# Patient Record
Sex: Female | Born: 1985 | Race: White | Hispanic: No | Marital: Married | Smoking: Never smoker
Health system: Southern US, Community
[De-identification: ages and names within clinical notes are randomized; demographics above are authoritative.]

## PROBLEM LIST (undated history)

## (undated) DIAGNOSIS — R87629 Unspecified abnormal cytological findings in specimens from vagina: Secondary | ICD-10-CM

## (undated) DIAGNOSIS — Z9889 Other specified postprocedural states: Secondary | ICD-10-CM

## (undated) DIAGNOSIS — O3440 Maternal care for other abnormalities of cervix, unspecified trimester: Secondary | ICD-10-CM

## (undated) HISTORY — DX: Unspecified abnormal cytological findings in specimens from vagina: R87.629

---

## 2010-09-05 HISTORY — PX: LEEP: SHX91

## 2016-03-04 ENCOUNTER — Ambulatory Visit (INDEPENDENT_AMBULATORY_CARE_PROVIDER_SITE_OTHER): Payer: BLUE CROSS/BLUE SHIELD | Admitting: Physician Assistant

## 2016-03-04 VITALS — BP 96/62 | HR 106 | Temp 98.2°F | Resp 18 | Ht 65.0 in | Wt 133.0 lb

## 2016-03-04 DIAGNOSIS — N390 Urinary tract infection, site not specified: Secondary | ICD-10-CM | POA: Diagnosis not present

## 2016-03-04 DIAGNOSIS — R1031 Right lower quadrant pain: Secondary | ICD-10-CM | POA: Diagnosis not present

## 2016-03-04 DIAGNOSIS — N12 Tubulo-interstitial nephritis, not specified as acute or chronic: Secondary | ICD-10-CM | POA: Diagnosis not present

## 2016-03-04 DIAGNOSIS — R82998 Other abnormal findings in urine: Secondary | ICD-10-CM

## 2016-03-04 LAB — COMPLETE METABOLIC PANEL WITH GFR
ALK PHOS: 58 U/L (ref 33–115)
ALT: 11 U/L (ref 6–29)
AST: 16 U/L (ref 10–30)
Albumin: 5 g/dL (ref 3.6–5.1)
BILIRUBIN TOTAL: 0.5 mg/dL (ref 0.2–1.2)
BUN: 20 mg/dL (ref 7–25)
CO2: 26 mmol/L (ref 20–31)
Calcium: 10.2 mg/dL (ref 8.6–10.2)
Chloride: 101 mmol/L (ref 98–110)
Creat: 0.92 mg/dL (ref 0.50–1.10)
GFR, EST NON AFRICAN AMERICAN: 84 mL/min (ref >=60)
GLUCOSE: 84 mg/dL (ref 65–99)
POTASSIUM: 4.3 mmol/L (ref 3.5–5.3)
SODIUM: 140 mmol/L (ref 135–146)
Total Protein: 7.7 g/dL (ref 6.1–8.1)

## 2016-03-04 LAB — POC MICROSCOPIC URINALYSIS (UMFC): MUCUS RE: ABSENT

## 2016-03-04 LAB — POCT URINALYSIS DIP (MANUAL ENTRY)
BILIRUBIN UA: NEGATIVE
BILIRUBIN UA: NEGATIVE
GLUCOSE UA: NEGATIVE
NITRITE UA: NEGATIVE
Protein Ur, POC: NEGATIVE
Spec Grav, UA: 1.01
Urobilinogen, UA: 0.2
pH, UA: 6.5

## 2016-03-04 LAB — POCT CBC
Granulocyte percent: 61 %G (ref 37–80)
HEMATOCRIT: 38.7 % (ref 37.7–47.9)
Hemoglobin: 14 g/dL (ref 12.2–16.2)
LYMPH, POC: 1.4 (ref 0.6–3.4)
MCH, POC: 31.2 pg (ref 27–31.2)
MCHC: 36.2 g/dL — AB (ref 31.8–35.4)
MCV: 86.2 fL (ref 80–97)
MID (CBC): 0.5 (ref 0–0.9)
MPV: 7 fL (ref 0–99.8)
POC GRANULOCYTE: 2.9 (ref 2–6.9)
POC LYMPH %: 29.1 % (ref 10–50)
POC MID %: 9.9 % (ref 0–12)
Platelet Count, POC: 207 10*3/uL (ref 142–424)
RBC: 4.49 M/uL (ref 4.04–5.48)
RDW, POC: 1.4 %
WBC: 4.7 10*3/uL (ref 4.6–10.2)

## 2016-03-04 LAB — POCT URINE PREGNANCY: PREG TEST UR: NEGATIVE

## 2016-03-04 MED ORDER — CIPROFLOXACIN HCL 500 MG PO TABS: 500.0000 mg | ORAL_TABLET | Freq: Two times a day (BID) | ORAL | Status: DC

## 2016-03-04 NOTE — Patient Instructions (Addendum)
IF you received an x-ray today, you will receive an invoice from [facility]. Please contact [facility] at [phone] with questions or concerns regarding your invoice.   IF you received labwork today, you will receive an invoice from [employer]. Please contact [facility] at [phone] with questions or concerns regarding your invoice.   Our billing staff will not be able to assist you with questions regarding bills from these companies.  You will be contacted with the lab results as soon as they are available. The fastest way to get your results is to activate your My Chart account. Instructions are located on the last page of this paperwork. If you have not heard from [place] regarding the results in 2 weeks, please contact this office.    Do not breast feed while taking this antibiotic.   I will have your lab results 7 days. Please finish antibiotic regiment. Pyelonephritis, Adult Pyelonephritis is a kidney infection. The kidneys are the organs that filter a person's blood and move waste out of the bloodstream and into the urine. Urine passes from the kidneys, through the ureters, and into the bladder. There are two main types of pyelonephritis:  Infections that come on quickly without any warning (acute pyelonephritis).  Infections that last for a long period of time (chronic pyelonephritis). In most cases, the infection clears up with treatment and does not cause further problems. More severe infections or chronic infections can sometimes spread to the bloodstream or lead to other problems with the kidneys. CAUSES This condition is usually caused by:  Bacteria traveling from the bladder to the kidney through infected urine. The urine in the bladder can become infected with bacteria from:  Bladder infection (cystitis).  Inflammation of the prostate gland (prostatitis).  Sexual intercourse, in females.  Bacteria traveling from the  bloodstream to the kidney. RISK FACTORS This condition is more likely to develop in:  Pregnant women.  Older people.  People who have diabetes.  People who have kidney stones or bladder stones.  People who have other abnormalities of the kidney or ureter.  People who have a catheter placed in the bladder.  People who have cancer.  People who are sexually active.  Women who use spermicides.  People who have had a prior urinary tract infection. SYMPTOMS Symptoms of this condition include:  Frequent urination.  Strong or persistent urge to urinate.  Burning or stinging when urinating.  Abdominal pain.  Back pain.  Pain in the side or flank area.  Fever.  Chills.  Blood in the urine, or dark urine.  Nausea.  Vomiting. DIAGNOSIS This condition may be diagnosed based on:  Medical history and physical exam.  Urine tests.  Blood tests. You may also have imaging tests of the kidneys, such as an ultrasound or CT scan. TREATMENT Treatment for this condition may depend on the severity of the infection.  If the infection is mild and is found early, you may be treated with antibiotic medicines taken by mouth. You will need to drink fluids to remain hydrated.  If the infection is more severe, you may need to stay in the hospital and receive antibiotics given directly into a vein through an IV tube. You may also need to receive fluids through an IV tube if you are not able to remain hydrated. After your hospital stay, you may need to take oral antibiotics for a period of time. Other treatments may be required, depending on the cause of the  infection. HOME CARE INSTRUCTIONS Medicines  Take over-the-counter and prescription medicines only as told by your health care provider.  If you were prescribed an antibiotic medicine, take it as told by your health care provider. Do not stop taking the antibiotic even if you start to feel better. General Instructions  Drink  enough fluid to keep your urine clear or pale yellow.  Avoid caffeine, tea, and carbonated beverages. They tend to irritate the bladder.  Urinate often. Avoid holding in urine for long periods of time.  Urinate before and after sex.  After a bowel movement, women should cleanse from front to back. Use each tissue only once.  Keep all follow-up visits as told by your health care provider. This is important. SEEK MEDICAL CARE IF:  Your symptoms do not get better after 2 days of treatment.  Your symptoms get worse.  You have a fever. SEEK IMMEDIATE MEDICAL CARE IF:  You are unable to take your antibiotics or fluids.  You have shaking chills.  You vomit.  You have severe flank or back pain.  You have extreme weakness or fainting.   This information is not intended to replace advice given to you by your health care provider. Make sure you discuss any questions you have with your health care provider.   Document Released: 08/22/2005 Document Revised: 05/13/2015 Document Reviewed: 12/15/2014 Elsevier Interactive Patient Education [employer].

## 2016-03-04 NOTE — Progress Notes (Signed)
[facility] [address] [place] [address] [phone]  Date:  03/04/2016   Name:  Erin [name]   DOB:  21-Oct-1985   MRN:  [id number]  PCP:  No primary care provider on file.   Chief Complaint  Patient presents with  . Flank Pain    Right side, x 2 days  . Back Pain    x 1 day  . Shoulder Pain    Right shoulder x 1 day    History of Present Illness:  Erin [name] is a 30 y.o. female patient who presents to [facility] right shoulder pain, back pain, flank pain within 2 days.    1 st day, seh was having pain in her shoulder, back and flank.  She has pressure of her right flank, that radiates through her back.  She is currently breast feeding.   She sleeps to her side.   Back and shoulder has resolved. Pressure has been strong at her right side.  No dysuria, hematuria or frequency.   She has no nausea.   She is pumping every 12 hours.  She is not engorged.   No abnormal discharge.  No hx of kidney stones.   She is currently taking birth control and pre-natals.    There are no active problems to display for this patient.   History reviewed. No pertinent past medical history.  Past Surgical History  Procedure Laterality Date  . Leep  2012    Social History  Substance Use Topics  . Smoking status: Never Smoker   . Smokeless tobacco: None  . Alcohol Use: None    Family History  Problem Relation Age of Onset  . Diabetes Mother     No Known Allergies  Medication list has been reviewed and updated.  No current outpatient prescriptions on file prior to visit.   No current facility-administered medications on file prior to visit.    ROS ROS otherwise unremarkable unless listed above.   Physical Examination: BP 96/62 mmHg  Pulse 106  Temp(Src) 98.2 F (36.8 C) (Oral)  Resp 18  Ht 5\' 5"  (1.651 m)  Wt 133 lb (60.328 kg)  BMI 22.13 kg/m2  SpO2 100% Ideal Body Weight: Weight in (lb) to have BMI = 25: 149.9  Physical Exam   Constitutional: She is oriented to person, place, and time. She appears well-developed and well-nourished. No distress.  HENT:  Head: Normocephalic and atraumatic.  Right Ear: External ear normal.  Left Ear: External ear normal.  Eyes: Conjunctivae and EOM are normal. Pupils are equal, round, and reactive to light.  Cardiovascular: Normal rate.   Pulmonary/Chest: Effort normal. No respiratory distress.  Abdominal: Soft. Normal appearance and bowel sounds are normal. There is no hepatosplenomegaly. There is tenderness in the suprapubic area. There is CVA tenderness (right sided minimal).  Neurological: She is alert and oriented to person, place, and time.  Skin: She is not diaphoretic.  Psychiatric: She has a normal mood and affect. Her behavior is normal.    Results for orders placed or performed in visit on 03/04/16  POCT CBC  Result Value Ref Range   WBC 4.7 4.6 - 10.2 K/uL   Lymph, poc 1.4 0.6 - 3.4   POC LYMPH PERCENT 29.1 10 - 50 %L   MID (cbc) 0.5 0 - 0.9   POC MID % 9.9 0 - 12 %M   POC Granulocyte 2.9 2 - 6.9   Granulocyte percent 61.0 37 - 80 %G  RBC 4.49 4.04 - 5.48 M/uL   Hemoglobin 14.0 12.2 - 16.2 g/dL   HCT, POC 38.7 37.7 - 47.9 %   MCV 86.2 80 - 97 fL   MCH, POC 31.2 27 - 31.2 pg   MCHC 36.2 (A) 31.8 - 35.4 g/dL   RDW, POC 1.4 %   Platelet Count, POC 207 142 - 424 K/uL   MPV 7.0 0 - 99.8 fL  POCT urinalysis dipstick  Result Value Ref Range   Color, UA yellow yellow   Clarity, UA clear clear   Glucose, UA negative negative   Bilirubin, UA negative negative   Ketones, POC UA negative negative   Spec Grav, UA 1.010    Blood, UA trace-lysed (A) negative   pH, UA 6.5    Protein Ur, POC negative negative   Urobilinogen, UA 0.2    Nitrite, UA Negative Negative   Leukocytes, UA moderate (2+) (A) Negative  POCT Microscopic Urinalysis ([facility])  Result Value Ref Range   WBC,UR,HPF,POC Many (A) None WBC/hpf   RBC,UR,HPF,POC None None RBC/hpf   Bacteria None None,  Too numerous to count   Mucus Absent Absent   Epithelial Cells, UR Per Microscopy Few (A) None, Too numerous to count cells/hpf  POCT urine pregnancy  Result Value Ref Range   Preg Test, Ur Negative Negative     Assessment and Plan: Erin [name] is a 30 y.o. female who is here today for cc of right flank pain. Shoulder and back pain appear separate.  Likely uti.  Will start treatment.  Advised to discontinue breastfeeding while on the cipro.  Possible pyelonephritis.  This is likely not pyelonephritis.  Will follow up pending culture.  Advised that this may change.  She has voiced understanding. Right lower quadrant abdominal pain - Plan: POCT CBC, POCT urinalysis dipstick, POCT Microscopic Urinalysis ([facility]), POCT urine pregnancy, COMPLETE METABOLIC PANEL WITH GFR, Urine culture, GC/Chlamydia Probe Amp  Pyelonephritis - Plan: DISCONTINUED: ciprofloxacin (CIPRO) 500 MG tablet  Leukocytes in urine - Plan: GC/Chlamydia Probe Amp, DISCONTINUED: ciprofloxacin (CIPRO) 500 MG tablet   Ivar Drape, PA-C [facility] 03/04/2016 11:04 AM

## 2016-03-05 LAB — URINE CULTURE: Colony Count: >=100000

## 2016-03-05 LAB — GC/CHLAMYDIA PROBE AMP
CT PROBE, AMP APTIMA: NOT DETECTED
GC PROBE AMP APTIMA: NOT DETECTED

## 2016-03-06 ENCOUNTER — Other Ambulatory Visit: Payer: Self-pay | Admitting: Physician Assistant

## 2016-03-06 DIAGNOSIS — N390 Urinary tract infection, site not specified: Secondary | ICD-10-CM

## 2016-03-06 MED ORDER — AMOXICILLIN-POT CLAVULANATE 875-125 MG PO TABS
1.0000 | ORAL_TABLET | Freq: Two times a day (BID) | ORAL | Status: AC
Start: 2016-03-06 — End: 2016-03-20

## 2016-03-16 ENCOUNTER — Telehealth: Payer: Self-pay

## 2016-03-16 NOTE — Telephone Encounter (Signed)
Patient called and stated she is out of her medicine her amoxicillin-clavulanate (AUGMENTIN) 875-125 MG tablet and she is still having illness issues and she also got a rash from this medicine wants to know what Erin [name] would like for her to do? Can someone call her. Thank you  Her call back number [phone]

## 2016-03-18 NOTE — Telephone Encounter (Signed)
Please advise 

## 2016-03-21 NOTE — Telephone Encounter (Signed)
She should return if she is experiencing rash.  Please tell her to return.

## 2016-03-22 NOTE — Telephone Encounter (Signed)
Pt states that the rash has resided and she is feeling better.

## 2016-08-31 ENCOUNTER — Other Ambulatory Visit: Payer: Self-pay | Admitting: Family Medicine

## 2016-08-31 DIAGNOSIS — R1011 Right upper quadrant pain: Secondary | ICD-10-CM

## 2016-09-01 ENCOUNTER — Ambulatory Visit
Admission: RE | Admit: 2016-09-01 | Discharge: 2016-09-01 | Disposition: A | Discharge status: Home / Self Care | Payer: BC Managed Care – PPO | Source: Ambulatory Visit | Attending: Family Medicine | Admitting: Family Medicine

## 2016-09-01 DIAGNOSIS — R1011 Right upper quadrant pain: Secondary | ICD-10-CM

## 2018-05-02 IMAGING — US US ABDOMEN LIMITED
1 series · 14 of 25 positions shown · non-contrast
Comparison: None.

CLINICAL DATA: Right upper quadrant abdominal pressure for 5 months

EXAM:
US ABDOMEN LIMITED - RIGHT UPPER QUADRANT

[Series 1: us abdomen limited · 0.18mm/px · 14 of 48 slices shown]
[im 1/48]
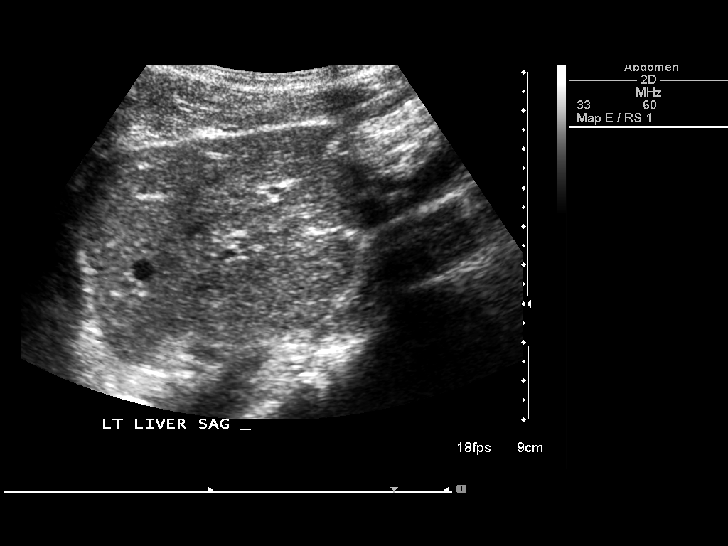
[im 4/48]
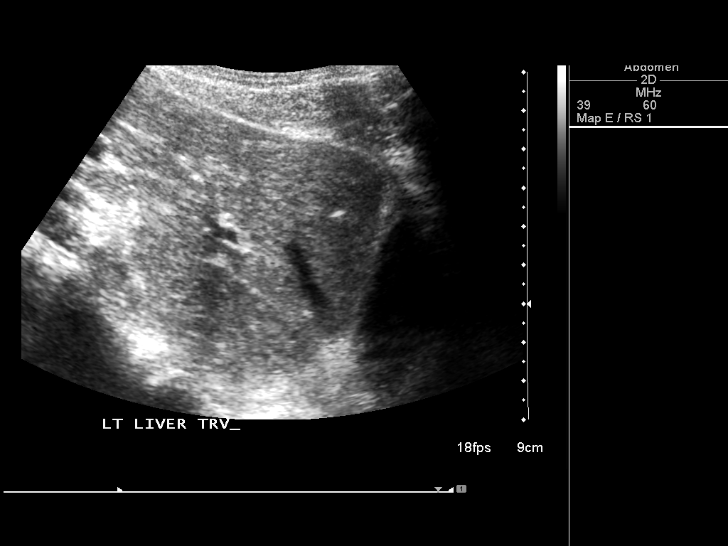
[im 8/48]
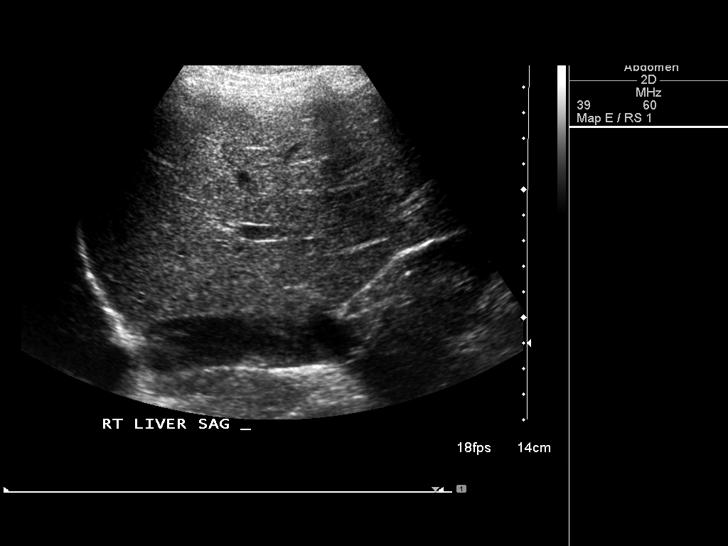
[im 12/48]
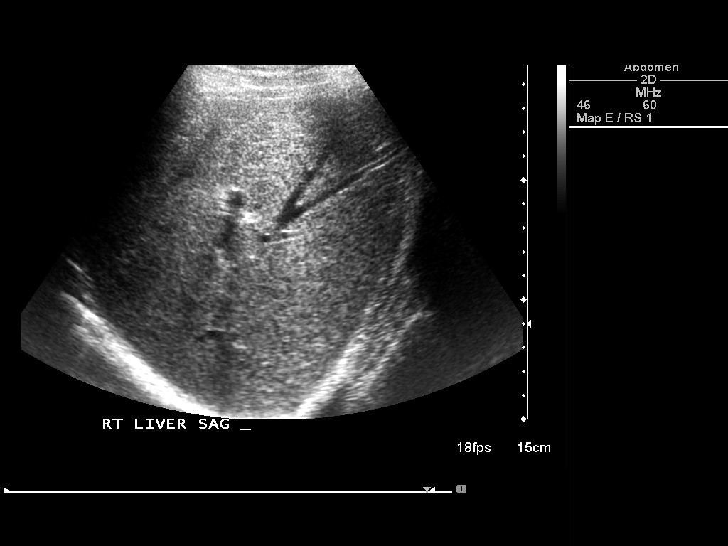
[im 16/48]
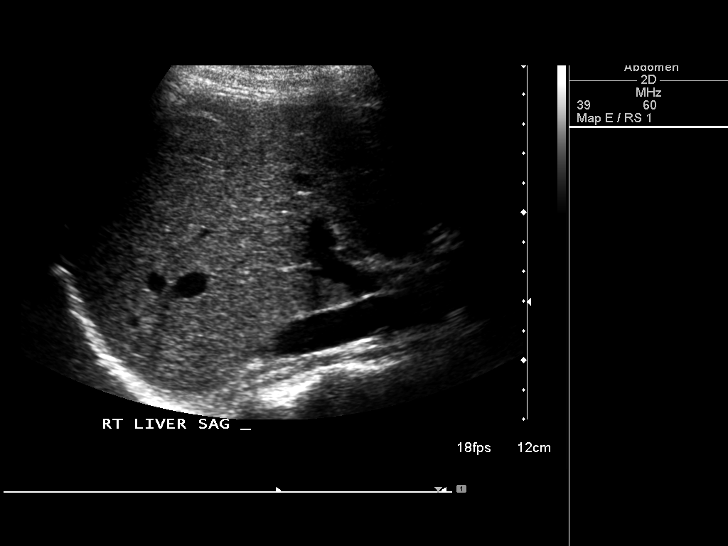
[im 18/48]
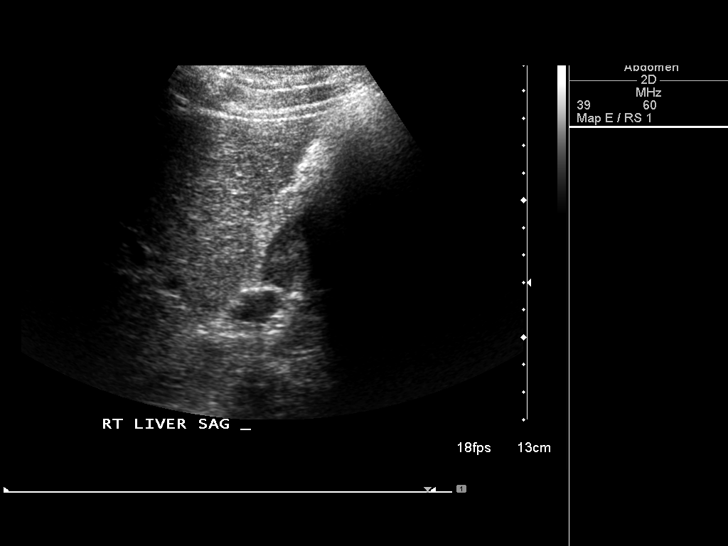
[im 22/48]
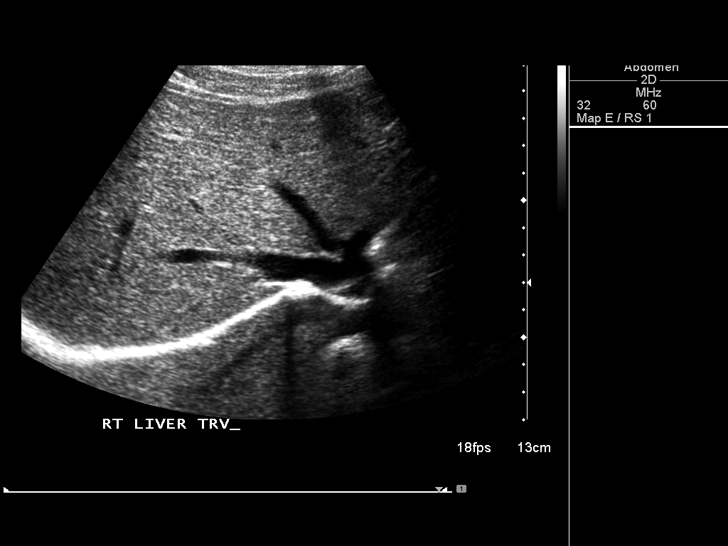
[im 26/48]
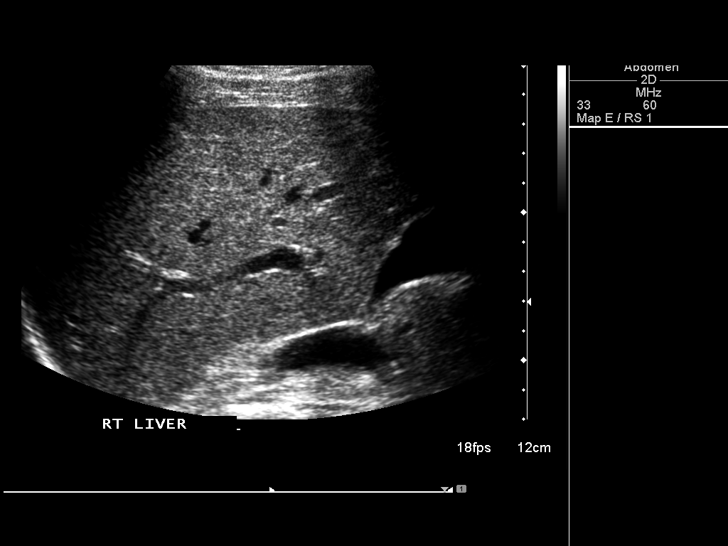
[im 30/48]
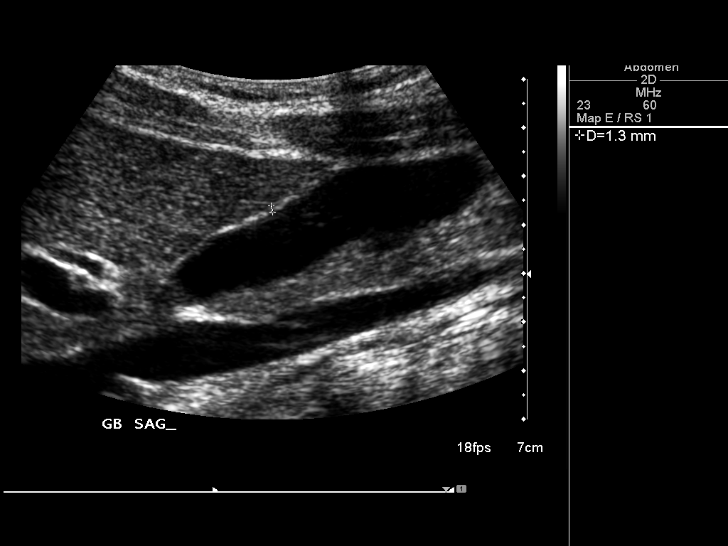
[im 32/48]
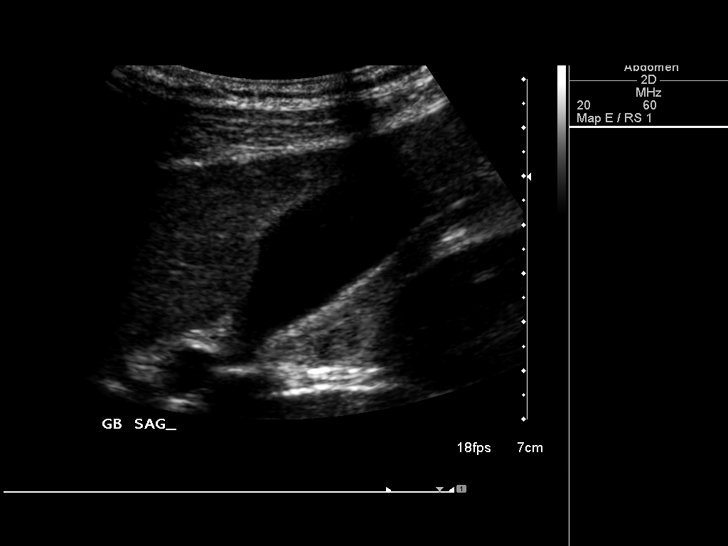
[im 36/48]
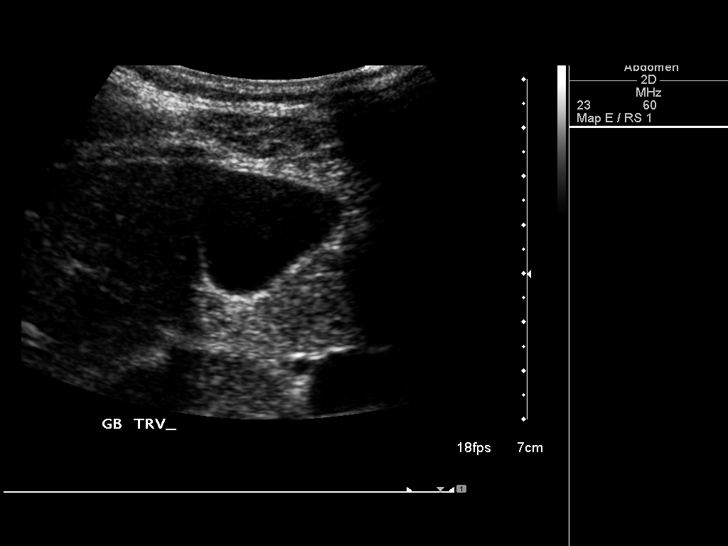
[im 40/48]
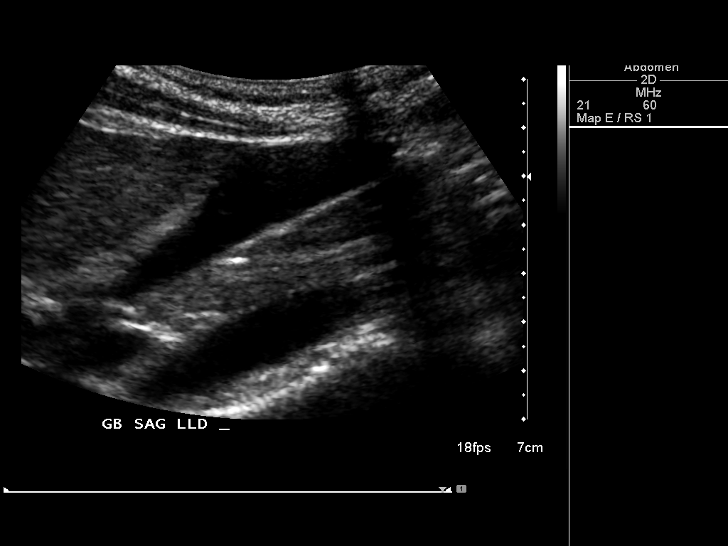
[im 44/48]
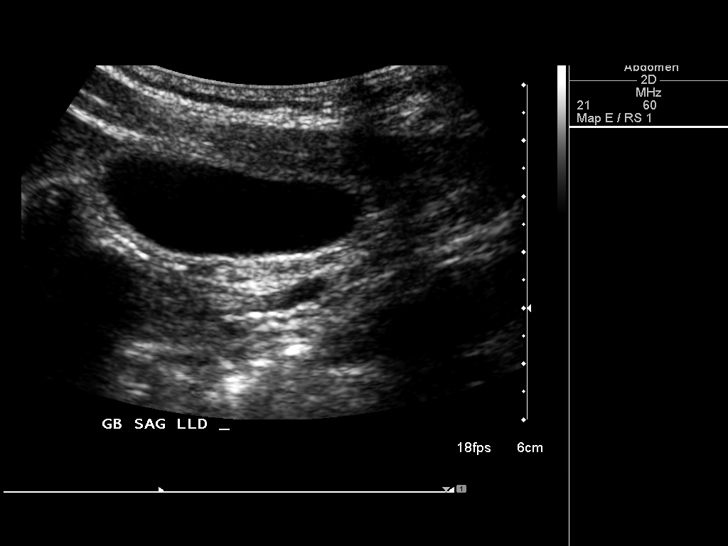
[im 48/48]
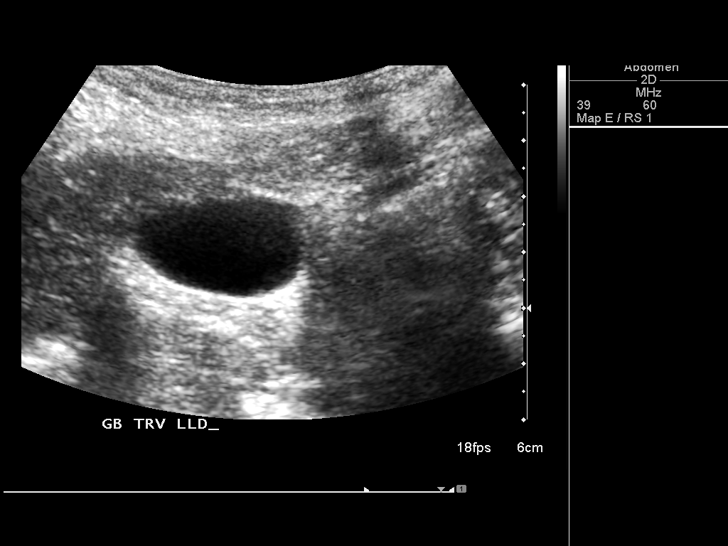

[14 of 25 positions shown; findings below may reference images not displayed]

FINDINGS: Gallbladder:

No gallstones or wall thickening visualized. No sonographic Murphy
sign noted by sonographer.

Common bile duct:

Diameter: Normal caliber, 3 mm

Liver:

No focal lesion identified. Within normal limits in parenchymal
echogenicity.
IMPRESSION: Unremarkable right upper quadrant ultrasound.

## 2019-02-28 LAB — OB RESULTS CONSOLE ABO/RH: RH Type: POSITIVE

## 2019-02-28 LAB — OB RESULTS CONSOLE ANTIBODY SCREEN: Antibody Screen: NEGATIVE

## 2019-02-28 LAB — OB RESULTS CONSOLE GC/CHLAMYDIA
Chlamydia: NEGATIVE
Gonorrhea: NEGATIVE

## 2019-02-28 LAB — OB RESULTS CONSOLE HEPATITIS B SURFACE ANTIGEN: Hepatitis B Surface Ag: NEGATIVE

## 2019-02-28 LAB — OB RESULTS CONSOLE HIV ANTIBODY (ROUTINE TESTING): HIV: NONREACTIVE

## 2019-02-28 LAB — OB RESULTS CONSOLE RPR: RPR: NONREACTIVE

## 2019-02-28 LAB — OB RESULTS CONSOLE RUBELLA ANTIBODY, IGM: Rubella: IMMUNE

## 2019-05-27 DIAGNOSIS — Z23 Encounter for immunization: Secondary | ICD-10-CM | POA: Diagnosis not present

## 2019-05-27 DIAGNOSIS — O09292 Supervision of pregnancy with other poor reproductive or obstetric history, second trimester: Secondary | ICD-10-CM | POA: Diagnosis not present

## 2019-05-27 DIAGNOSIS — Z3A27 27 weeks gestation of pregnancy: Secondary | ICD-10-CM | POA: Diagnosis not present

## 2019-05-27 DIAGNOSIS — Z3480 Encounter for supervision of other normal pregnancy, unspecified trimester: Secondary | ICD-10-CM | POA: Diagnosis not present

## 2019-05-27 DIAGNOSIS — Z3A23 23 weeks gestation of pregnancy: Secondary | ICD-10-CM | POA: Diagnosis not present

## 2019-05-27 DIAGNOSIS — Z3689 Encounter for other specified antenatal screening: Secondary | ICD-10-CM | POA: Diagnosis not present

## 2019-06-24 DIAGNOSIS — Z3689 Encounter for other specified antenatal screening: Secondary | ICD-10-CM | POA: Diagnosis not present

## 2019-06-24 DIAGNOSIS — O09292 Supervision of pregnancy with other poor reproductive or obstetric history, second trimester: Secondary | ICD-10-CM | POA: Diagnosis not present

## 2019-06-24 DIAGNOSIS — Z3A27 27 weeks gestation of pregnancy: Secondary | ICD-10-CM | POA: Diagnosis not present

## 2019-07-08 DIAGNOSIS — O09293 Supervision of pregnancy with other poor reproductive or obstetric history, third trimester: Secondary | ICD-10-CM | POA: Diagnosis not present

## 2019-07-08 DIAGNOSIS — Z3A29 29 weeks gestation of pregnancy: Secondary | ICD-10-CM | POA: Diagnosis not present

## 2019-07-08 DIAGNOSIS — Z23 Encounter for immunization: Secondary | ICD-10-CM | POA: Diagnosis not present

## 2019-07-22 DIAGNOSIS — Z3A31 31 weeks gestation of pregnancy: Secondary | ICD-10-CM | POA: Diagnosis not present

## 2019-07-22 DIAGNOSIS — O09293 Supervision of pregnancy with other poor reproductive or obstetric history, third trimester: Secondary | ICD-10-CM | POA: Diagnosis not present

## 2019-08-06 DIAGNOSIS — Z3A33 33 weeks gestation of pregnancy: Secondary | ICD-10-CM | POA: Diagnosis not present

## 2019-08-06 DIAGNOSIS — O09293 Supervision of pregnancy with other poor reproductive or obstetric history, third trimester: Secondary | ICD-10-CM | POA: Diagnosis not present

## 2019-08-14 ENCOUNTER — Encounter (HOSPITAL_COMMUNITY): Payer: Self-pay

## 2019-08-14 ENCOUNTER — Inpatient Hospital Stay (HOSPITAL_BASED_OUTPATIENT_CLINIC_OR_DEPARTMENT_OTHER): Payer: BC Managed Care – PPO

## 2019-08-14 ENCOUNTER — Other Ambulatory Visit: Payer: Self-pay

## 2019-08-14 ENCOUNTER — Inpatient Hospital Stay (HOSPITAL_COMMUNITY)
Admission: AD | Admit: 2019-08-14 | Discharge: 2019-08-14 | Disposition: A | Discharge status: Home / Self Care | Payer: BC Managed Care – PPO | Attending: Obstetrics | Admitting: Obstetrics

## 2019-08-14 DIAGNOSIS — O26893 Other specified pregnancy related conditions, third trimester: Secondary | ICD-10-CM | POA: Diagnosis not present

## 2019-08-14 DIAGNOSIS — M7989 Other specified soft tissue disorders: Secondary | ICD-10-CM | POA: Diagnosis not present

## 2019-08-14 DIAGNOSIS — M79605 Pain in left leg: Secondary | ICD-10-CM | POA: Insufficient documentation

## 2019-08-14 DIAGNOSIS — Z91013 Allergy to seafood: Secondary | ICD-10-CM | POA: Diagnosis not present

## 2019-08-14 DIAGNOSIS — Z91048 Other nonmedicinal substance allergy status: Secondary | ICD-10-CM | POA: Insufficient documentation

## 2019-08-14 DIAGNOSIS — Z833 Family history of diabetes mellitus: Secondary | ICD-10-CM | POA: Diagnosis not present

## 2019-08-14 DIAGNOSIS — Z880 Allergy status to penicillin: Secondary | ICD-10-CM | POA: Diagnosis not present

## 2019-08-14 DIAGNOSIS — O99891 Other specified diseases and conditions complicating pregnancy: Secondary | ICD-10-CM | POA: Diagnosis not present

## 2019-08-14 DIAGNOSIS — O2203 Varicose veins of lower extremity in pregnancy, third trimester: Secondary | ICD-10-CM | POA: Diagnosis not present

## 2019-08-14 DIAGNOSIS — Z3A34 34 weeks gestation of pregnancy: Secondary | ICD-10-CM | POA: Diagnosis not present

## 2019-08-14 DIAGNOSIS — M79609 Pain in unspecified limb: Secondary | ICD-10-CM | POA: Diagnosis not present

## 2019-08-14 DIAGNOSIS — I781 Nevus, non-neoplastic: Secondary | ICD-10-CM

## 2019-08-14 DIAGNOSIS — M79672 Pain in left foot: Secondary | ICD-10-CM | POA: Diagnosis not present

## 2019-08-14 DIAGNOSIS — Z3689 Encounter for other specified antenatal screening: Secondary | ICD-10-CM

## 2019-08-14 HISTORY — DX: Maternal care for other abnormalities of cervix, unspecified trimester: O34.40

## 2019-08-14 HISTORY — DX: Other specified postprocedural states: Z98.890

## 2019-08-14 LAB — URINALYSIS, ROUTINE W REFLEX MICROSCOPIC
Bilirubin Urine: NEGATIVE
Glucose, UA: NEGATIVE mg/dL
Hgb urine dipstick: NEGATIVE
Ketones, ur: NEGATIVE mg/dL
Nitrite: NEGATIVE
Protein, ur: NEGATIVE mg/dL
Specific Gravity, Urine: 1.013 (ref 1.005–1.030)
pH: 7 (ref 5.0–8.0)

## 2019-08-14 NOTE — MAU Note (Signed)
Erin [name] is a 33 y.o. at [redacted]w[redacted]d here in MAU reporting: toes are darker on her left leg since last night. Having some swelling in left ankle and foot. Also having some tingling behind her left thigh, states it is not painful but she can feel the sensation. No contractions, VB, or LOF. +FM  Onset of complaint: yesterday  Pain score: 0/10  Vitals:   08/14/19 1754  BP: 111/68  Pulse: 93  Resp: 16  Temp: 98.3 F (36.8 C)  SpO2: 100%     FHT: +FM  Lab orders placed from triage: UA

## 2019-08-14 NOTE — Progress Notes (Signed)
Left lower extremity venous duplex completed. Refer to "CV Proc" under chart review to view preliminary results.  08/14/2019 7:04 PM Kelby Aline., MHA, RVT, RDCS, RDMS

## 2019-08-14 NOTE — MAU Note (Signed)
Vascular tech in room.

## 2019-08-14 NOTE — MAU Provider Note (Signed)
History     CSN: [id number]  Arrival date and time: 08/14/19 1734   First Provider Initiated Contact with Patient 08/14/19 1857      Chief Complaint  Patient presents with  . Foot Swelling   HPI   Ms.Erin [name] is a 33 y.o. female G2P1001 @ [redacted]w[redacted]d here with left foot swelling and pain that has been present since yesterday. She has noticed color changes in her left ankle and left foot. No recent injury. She is able to ambulate normally. She has noticed that her left thigh has had more spider veins. She feels a tingling sensation in left foot and left calf. No pain, just a new sensation.    OB History    Gravida  2   Para  1   Term  1   Preterm      AB      Living  1     SAB      TAB      Ectopic      Multiple      Live Births  1           Past Medical History:  Diagnosis Date  . History of cervical LEEP biopsy affecting care of mother, antepartum     Past Surgical History:  Procedure Laterality Date  . LEEP  2012    Family History  Problem Relation Age of Onset  . Diabetes Mother     Social History   Tobacco Use  . Smoking status: Never Smoker  . Smokeless tobacco: Never Used  Substance Use Topics  . Alcohol use: Not Currently    Alcohol/week: 0.0 standard drinks  . Drug use: Not Currently    Allergies:  Allergies  Allergen Reactions  . Amoxicillin   . Shrimp [Shellfish Allergy] Swelling  . Nickel Rash    Medications Prior to Admission  Medication Sig Dispense Refill Last Dose  . Prenatal Vit-Fe Fumarate-FA (PRENATAL MULTIVITAMIN) TABS tablet Take 1 tablet by mouth daily at 12 noon.   08/14/2019 at Unknown time  . NORETHINDRONE PO Take by mouth.   More than a month at Unknown time   Results for orders placed or performed during the hospital encounter of 08/14/19 (from the past 48 hour(s))  Urinalysis, Routine w reflex microscopic     Status: Abnormal   Collection Time: 08/14/19  6:47 PM  Result Value Ref Range   Color,  Urine YELLOW YELLOW   APPearance HAZY (A) CLEAR   Specific Gravity, Urine 1.013 1.005 - 1.030   pH 7.0 5.0 - 8.0   Glucose, UA NEGATIVE NEGATIVE mg/dL   Hgb urine dipstick NEGATIVE NEGATIVE   Bilirubin Urine NEGATIVE NEGATIVE   Ketones, ur NEGATIVE NEGATIVE mg/dL   Protein, ur NEGATIVE NEGATIVE mg/dL   Nitrite NEGATIVE NEGATIVE   Leukocytes,Ua SMALL (A) NEGATIVE   RBC / HPF 0-5 0 - 5 RBC/hpf   WBC, UA 6-10 0 - 5 WBC/hpf   Bacteria, UA FEW (A) NONE SEEN   Squamous Epithelial / LPF 11-20 0 - 5   Mucus PRESENT     Comment: Performed at [facility], [address] [place], [place] [address]   Review of Systems  Respiratory: Negative for shortness of breath.   Cardiovascular: Positive for leg swelling. Negative for chest pain.  Musculoskeletal: Negative for gait problem.  Skin: Positive for color change.   Physical Exam   Blood pressure 111/68, pulse 93, temperature 98.3 F (36.8 C), temperature source Oral, resp.  rate 16, height 5\' 5"  (1.651 m), weight 67.8 kg, SpO2 100 %.  Physical Exam  Constitutional: She is oriented to person, place, and time. She appears well-developed and well-nourished. No distress.  HENT:  Head: Normocephalic.  Cardiovascular: Normal pulses.  Pulses:      Dorsalis pedis pulses are 2+ on the right side and 2+ on the left side.  Respiratory: Effort normal.  Musculoskeletal: Normal range of motion.  Neurological: She is oriented to person, place, and time.  Skin: She is not diaphoretic.   Fetal Tracing:  Baseline: 130 bpm Variability: Moderate  Accelerations: 15x15 Decelerations: None Toco: None  MAU Course  Procedures  None  MDM  Bedside DVT vascular study negative for DVT.  Assessment and Plan   A:  1. Left leg pain   2. Spider veins   3. [redacted] weeks gestation of pregnancy     P:  Discharge home in stable condition DVT study negative Discussed wearing Ted hose daily. Alternate between elevating and mobilizing.  F/u with  OB as scheduled Return to MAU if symptoms worsen   Arihanna Estabrook, Artist Pais, NP 08/14/2019 7:26 PM

## 2019-08-14 NOTE — Discharge Instructions (Signed)

## 2019-08-19 DIAGNOSIS — Z3689 Encounter for other specified antenatal screening: Secondary | ICD-10-CM | POA: Diagnosis not present

## 2019-08-19 DIAGNOSIS — O09293 Supervision of pregnancy with other poor reproductive or obstetric history, third trimester: Secondary | ICD-10-CM | POA: Diagnosis not present

## 2019-08-19 DIAGNOSIS — Z3A35 35 weeks gestation of pregnancy: Secondary | ICD-10-CM | POA: Diagnosis not present

## 2019-08-19 DIAGNOSIS — Z3685 Encounter for antenatal screening for Streptococcus B: Secondary | ICD-10-CM | POA: Diagnosis not present

## 2019-08-23 LAB — OB RESULTS CONSOLE GBS: GBS: POSITIVE

## 2019-09-02 DIAGNOSIS — O09293 Supervision of pregnancy with other poor reproductive or obstetric history, third trimester: Secondary | ICD-10-CM | POA: Diagnosis not present

## 2019-09-02 DIAGNOSIS — Z3A37 37 weeks gestation of pregnancy: Secondary | ICD-10-CM | POA: Diagnosis not present

## 2019-09-03 ENCOUNTER — Encounter (HOSPITAL_COMMUNITY): Payer: Self-pay | Admitting: *Deleted

## 2019-09-03 ENCOUNTER — Telehealth (HOSPITAL_COMMUNITY): Payer: Self-pay | Admitting: *Deleted

## 2019-09-03 NOTE — Telephone Encounter (Signed)
Preadmission screen  

## 2019-09-08 ENCOUNTER — Inpatient Hospital Stay (EMERGENCY_DEPARTMENT_HOSPITAL)
Admission: AD | Admit: 2019-09-08 | Discharge: 2019-09-08 | Disposition: A | Discharge status: Home / Self Care | Payer: BC Managed Care – PPO | Source: Home / Self Care | Attending: Obstetrics | Admitting: Obstetrics

## 2019-09-08 ENCOUNTER — Encounter (HOSPITAL_COMMUNITY): Payer: Self-pay | Admitting: Obstetrics

## 2019-09-08 ENCOUNTER — Other Ambulatory Visit: Payer: Self-pay

## 2019-09-08 DIAGNOSIS — O09293 Supervision of pregnancy with other poor reproductive or obstetric history, third trimester: Secondary | ICD-10-CM | POA: Diagnosis not present

## 2019-09-08 DIAGNOSIS — N8301 Follicular cyst of right ovary: Secondary | ICD-10-CM | POA: Diagnosis not present

## 2019-09-08 DIAGNOSIS — O26893 Other specified pregnancy related conditions, third trimester: Secondary | ICD-10-CM | POA: Diagnosis not present

## 2019-09-08 DIAGNOSIS — Z0371 Encounter for suspected problem with amniotic cavity and membrane ruled out: Secondary | ICD-10-CM

## 2019-09-08 DIAGNOSIS — O321XX0 Maternal care for breech presentation, not applicable or unspecified: Secondary | ICD-10-CM | POA: Diagnosis not present

## 2019-09-08 DIAGNOSIS — D270 Benign neoplasm of right ovary: Secondary | ICD-10-CM | POA: Diagnosis not present

## 2019-09-08 DIAGNOSIS — Z3A38 38 weeks gestation of pregnancy: Secondary | ICD-10-CM

## 2019-09-08 DIAGNOSIS — O99892 Other specified diseases and conditions complicating childbirth: Secondary | ICD-10-CM | POA: Diagnosis not present

## 2019-09-08 DIAGNOSIS — O99824 Streptococcus B carrier state complicating childbirth: Secondary | ICD-10-CM | POA: Diagnosis not present

## 2019-09-08 DIAGNOSIS — O9962 Diseases of the digestive system complicating childbirth: Secondary | ICD-10-CM | POA: Diagnosis not present

## 2019-09-08 DIAGNOSIS — D62 Acute posthemorrhagic anemia: Secondary | ICD-10-CM | POA: Diagnosis not present

## 2019-09-08 DIAGNOSIS — N83291 Other ovarian cyst, right side: Secondary | ICD-10-CM | POA: Diagnosis not present

## 2019-09-08 DIAGNOSIS — K219 Gastro-esophageal reflux disease without esophagitis: Secondary | ICD-10-CM | POA: Diagnosis not present

## 2019-09-08 DIAGNOSIS — Z23 Encounter for immunization: Secondary | ICD-10-CM | POA: Diagnosis not present

## 2019-09-08 DIAGNOSIS — Z3A00 Weeks of gestation of pregnancy not specified: Secondary | ICD-10-CM | POA: Diagnosis not present

## 2019-09-08 DIAGNOSIS — Z20822 Contact with and (suspected) exposure to covid-19: Secondary | ICD-10-CM | POA: Diagnosis not present

## 2019-09-08 DIAGNOSIS — N831 Corpus luteum cyst of ovary, unspecified side: Secondary | ICD-10-CM | POA: Diagnosis not present

## 2019-09-08 DIAGNOSIS — O9081 Anemia of the puerperium: Secondary | ICD-10-CM | POA: Diagnosis not present

## 2019-09-08 LAB — AMNISURE RUPTURE OF MEMBRANE (ROM) NOT AT ARMC: Amnisure ROM: NEGATIVE

## 2019-09-08 LAB — POCT FERN TEST: POCT Fern Test: NEGATIVE

## 2019-09-08 NOTE — Discharge Instructions (Signed)
Braxton Hicks Contractions °Contractions of the uterus can occur throughout pregnancy, but they are not always a sign that you are in labor. You may have practice contractions called Braxton Hicks contractions. These false labor contractions are sometimes confused with true labor. °What are Braxton Hicks contractions? °Braxton Hicks contractions are tightening movements that occur in the muscles of the uterus before labor. Unlike true labor contractions, these contractions do not result in opening (dilation) and thinning of the cervix. Toward the end of pregnancy (32-34 weeks), Braxton Hicks contractions can happen more often and may become stronger. These contractions are sometimes difficult to tell apart from true labor because they can be very uncomfortable. You should not feel embarrassed if you go to the hospital with false labor. °Sometimes, the only way to tell if you are in true labor is for your health care provider to look for changes in the cervix. The health care provider will do a physical exam and may monitor your contractions. If you are not in true labor, the exam should show that your cervix is not dilating and your water has not broken. °If there are no other health problems associated with your pregnancy, it is completely safe for you to be sent home with false labor. You may continue to have Braxton Hicks contractions until you go into true labor. °How to tell the difference between true labor and false labor °True labor °· Contractions last 30-70 seconds. °· Contractions become very regular. °· Discomfort is usually felt in the top of the uterus, and it spreads to the lower abdomen and low back. °· Contractions do not go away with walking. °· Contractions usually become more intense and increase in frequency. °· The cervix dilates and gets thinner. °False labor °· Contractions are usually shorter and not as strong as true labor contractions. °· Contractions are usually irregular. °· Contractions  are often felt in the front of the lower abdomen and in the groin. °· Contractions may go away when you walk around or change positions while lying down. °· Contractions get weaker and are shorter-lasting as time goes on. °· The cervix usually does not dilate or become thin. °Follow these instructions at home: ° °· Take over-the-counter and prescription medicines only as told by your health care provider. °· Keep up with your usual exercises and follow other instructions from your health care provider. °· Eat and drink lightly if you think you are going into labor. °· If Braxton Hicks contractions are making you uncomfortable: °? Change your position from lying down or resting to walking, or change from walking to resting. °? Sit and rest in a tub of warm water. °? Drink enough fluid to keep your urine pale yellow. Dehydration may cause these contractions. °? Do slow and deep breathing several times an hour. °· Keep all follow-up prenatal visits as told by your health care provider. This is important. °Contact a health care provider if: °· You have a fever. °· You have continuous pain in your abdomen. °Get help right away if: °· Your contractions become stronger, more regular, and closer together. °· You have fluid leaking or gushing from your vagina. °· You pass blood-tinged mucus (bloody show). °· You have bleeding from your vagina. °· You have low back pain that you never had before. °· You feel your baby’s head pushing down and causing pelvic pressure. °· Your baby is not moving inside you as much as it used to. °Summary °· Contractions that occur before labor are   called Braxton Hicks contractions, false labor, or practice contractions. °· Braxton Hicks contractions are usually shorter, weaker, farther apart, and less regular than true labor contractions. True labor contractions usually become progressively stronger and regular, and they become more frequent. °· Manage discomfort from Braxton Hicks contractions  by changing position, resting in a warm bath, drinking plenty of water, or practicing deep breathing. °This information is not intended to replace advice given to you by your health care provider. Make sure you discuss any questions you have with your health care provider. °Document Revised: 08/04/2017 Document Reviewed: 01/05/2017 °Elsevier Patient Education © 2020 [employer] ° °

## 2019-09-08 NOTE — MAU Provider Note (Signed)
Chief Complaint  Patient presents with  . Rupture of Membranes     First Provider Initiated Contact with Patient 09/08/19 1452      S: Erin [name]  is a 34 y.o. y.o. year old G52P1011 female at [redacted]w[redacted]d weeks gestation who presents to MAU reporting leaking small amount of clear fluid intermittently since 0130.   Contractions: Irreg, mild Vaginal bleeding: Denies Fetal movement: Nml  O:  Patient Vitals for the past 24 hrs:  BP Temp Temp src Pulse Resp SpO2 Height Weight  09/08/19 1421 128/88 98.6 F (37 C) Oral (!) 109 16 99 % -- --  09/08/19 1417 -- -- -- -- -- -- 5\' 5"  (1.651 m) 68.9 kg   General: NAD Heart: Regular rate Lungs: Normal rate and effort Abd: Soft, NT, Gravid, S=D Pelvic: NEFG, Neg pooling, no blood. Small amount of mucoid discharge Dilation: 2 Effacement (%): Thick Cervical Position: Posterior Station: -3 Presentation: Vertex Exam by:: Manya Silvas, CNM  EFM: 140, Moderate variability, 15 x 15 accelerations, no decelerations Toco: Irreg, mild  Neg Fern Neg Amnisure  A: [redacted]w[redacted]d week IUP No evidence of SROM or labor FHR reactive  P: Discharge home in stable condition. Labor precautions and fetal kick counts. Follow-up as scheduled for prenatal visit or sooner as needed if symptoms worsen. Return to maternity admissions as needed if symptoms worsen.  Tamala Julian, [place], [place] 09/08/2019 4:02 PM  2

## 2019-09-08 NOTE — MAU Note (Signed)
Erin [name] is a 33 y.o. at [redacted]w[redacted]d here in MAU reporting: LOF since 0130, states she has noticed it throughout the day. Was wearing a panty liner but has since taken it off. Fluid is clear. Having some pressure but no contractions. No bleeding. +FM  Onset of complaint: today  Pain score: 0/10  Vitals:   09/08/19 1421  BP: 128/88  Pulse: (!) 109  Resp: 16  Temp: 98.6 F (37 C)  SpO2: 99%     FHT: +FM  Lab orders placed from triage: none

## 2019-09-09 ENCOUNTER — Inpatient Hospital Stay (HOSPITAL_COMMUNITY): Payer: BC Managed Care – PPO | Admitting: Anesthesiology

## 2019-09-09 ENCOUNTER — Inpatient Hospital Stay (HOSPITAL_COMMUNITY)
Admission: AD | Admit: 2019-09-09 | Discharge: 2019-09-11 | DRG: 787 | Disposition: A | Discharge status: Home / Self Care | Payer: BC Managed Care – PPO | Attending: Obstetrics and Gynecology | Admitting: Obstetrics and Gynecology

## 2019-09-09 ENCOUNTER — Encounter (HOSPITAL_COMMUNITY): Payer: Self-pay | Admitting: Obstetrics and Gynecology

## 2019-09-09 ENCOUNTER — Encounter (HOSPITAL_COMMUNITY)
Admission: AD | Disposition: A | Discharge status: Home / Self Care | Payer: Self-pay | Source: Home / Self Care | Attending: Obstetrics and Gynecology

## 2019-09-09 DIAGNOSIS — O09293 Supervision of pregnancy with other poor reproductive or obstetric history, third trimester: Secondary | ICD-10-CM | POA: Diagnosis not present

## 2019-09-09 DIAGNOSIS — O321XX0 Maternal care for breech presentation, not applicable or unspecified: Secondary | ICD-10-CM | POA: Diagnosis present

## 2019-09-09 DIAGNOSIS — Z3A38 38 weeks gestation of pregnancy: Secondary | ICD-10-CM | POA: Diagnosis not present

## 2019-09-09 DIAGNOSIS — K219 Gastro-esophageal reflux disease without esophagitis: Secondary | ICD-10-CM | POA: Diagnosis present

## 2019-09-09 DIAGNOSIS — D270 Benign neoplasm of right ovary: Secondary | ICD-10-CM | POA: Diagnosis present

## 2019-09-09 DIAGNOSIS — O9962 Diseases of the digestive system complicating childbirth: Secondary | ICD-10-CM | POA: Diagnosis present

## 2019-09-09 DIAGNOSIS — Z20822 Contact with and (suspected) exposure to covid-19: Secondary | ICD-10-CM | POA: Diagnosis present

## 2019-09-09 DIAGNOSIS — D62 Acute posthemorrhagic anemia: Secondary | ICD-10-CM | POA: Diagnosis not present

## 2019-09-09 DIAGNOSIS — O9081 Anemia of the puerperium: Secondary | ICD-10-CM | POA: Diagnosis not present

## 2019-09-09 DIAGNOSIS — Z0371 Encounter for suspected problem with amniotic cavity and membrane ruled out: Secondary | ICD-10-CM | POA: Diagnosis not present

## 2019-09-09 DIAGNOSIS — O99892 Other specified diseases and conditions complicating childbirth: Secondary | ICD-10-CM | POA: Diagnosis present

## 2019-09-09 DIAGNOSIS — O99824 Streptococcus B carrier state complicating childbirth: Secondary | ICD-10-CM | POA: Diagnosis present

## 2019-09-09 DIAGNOSIS — Z98891 History of uterine scar from previous surgery: Secondary | ICD-10-CM

## 2019-09-09 DIAGNOSIS — O26893 Other specified pregnancy related conditions, third trimester: Secondary | ICD-10-CM | POA: Diagnosis present

## 2019-09-09 LAB — CBC
HCT: 38.1 % (ref 36.0–46.0)
Hemoglobin: 12.8 g/dL (ref 12.0–15.0)
MCH: 31.4 pg (ref 26.0–34.0)
MCHC: 33.6 g/dL (ref 30.0–36.0)
MCV: 93.4 fL (ref 80.0–100.0)
Platelets: 303 10*3/uL (ref 150–400)
RBC: 4.08 MIL/uL (ref 3.87–5.11)
RDW: 12.6 % (ref 11.5–15.5)
WBC: 15.4 10*3/uL — ABNORMAL HIGH (ref 4.0–10.5)
nRBC: 0 % (ref 0.0–0.2)

## 2019-09-09 LAB — TYPE AND SCREEN
ABO/RH(D): AB POS
Antibody Screen: NEGATIVE

## 2019-09-09 LAB — RESPIRATORY PANEL BY RT PCR (FLU A&B, COVID)
Influenza A by PCR: NEGATIVE
Influenza B by PCR: NEGATIVE
SARS Coronavirus 2 by RT PCR: NEGATIVE

## 2019-09-09 SURGERY — Surgical Case
Anesthesia: Spinal

## 2019-09-09 MED ORDER — TERBUTALINE SULFATE 1 MG/ML IJ SOLN
0.2500 mg | Freq: Once | INTRAMUSCULAR | Status: AC
  Administered 2019-09-09: 23:00:00 0.25 mg via SUBCUTANEOUS
  Filled 2019-09-09: qty 1

## 2019-09-09 MED ORDER — FENTANYL CITRATE (PF) 100 MCG/2ML IJ SOLN
INTRAMUSCULAR | Status: AC
  Filled 2019-09-09: qty 2

## 2019-09-09 MED ORDER — CLINDAMYCIN PHOSPHATE 900 MG/50ML IV SOLN
INTRAVENOUS | Status: AC
  Filled 2019-09-09: qty 50

## 2019-09-09 MED ORDER — FAMOTIDINE IN NACL 20-0.9 MG/50ML-% IV SOLN: 20.0000 mg | Freq: Once | INTRAVENOUS | Status: DC

## 2019-09-09 MED ORDER — LACTATED RINGERS IV BOLUS
1000.0000 mL | Freq: Once | INTRAVENOUS | Status: AC
  Administered 2019-09-09: 22:00:00 1000 mL via INTRAVENOUS

## 2019-09-09 MED ORDER — PHENYLEPHRINE HCL-NACL 20-0.9 MG/250ML-% IV SOLN
INTRAVENOUS | Status: DC | PRN
  Administered 2019-09-09: 60 ug/min via INTRAVENOUS

## 2019-09-09 MED ORDER — FENTANYL CITRATE (PF) 100 MCG/2ML IJ SOLN
INTRAMUSCULAR | Status: DC | PRN
  Administered 2019-09-09: 15 ug via INTRATHECAL

## 2019-09-09 MED ORDER — FAMOTIDINE IN NACL 20-0.9 MG/50ML-% IV SOLN
20.0000 mg | Freq: Once | INTRAVENOUS | Status: AC
  Administered 2019-09-09: 20 mg via INTRAVENOUS
  Filled 2019-09-09: qty 50

## 2019-09-09 MED ORDER — SOD CITRATE-CITRIC ACID 500-334 MG/5ML PO SOLN
30.0000 mL | Freq: Once | ORAL | Status: AC
  Administered 2019-09-09: 23:00:00 30 mL via ORAL
  Filled 2019-09-09: qty 30

## 2019-09-09 MED ORDER — DEXAMETHASONE SODIUM PHOSPHATE 10 MG/ML IJ SOLN
INTRAMUSCULAR | Status: AC
  Filled 2019-09-09: qty 1

## 2019-09-09 MED ORDER — MORPHINE SULFATE (PF) 0.5 MG/ML IJ SOLN
INTRAMUSCULAR | Status: DC | PRN
  Administered 2019-09-09: .15 mg via INTRATHECAL

## 2019-09-09 MED ORDER — BUPIVACAINE HCL (PF) 0.25 % IJ SOLN
INTRAMUSCULAR | Status: AC
  Filled 2019-09-09: qty 30

## 2019-09-09 MED ORDER — LACTATED RINGERS IV SOLN: INTRAVENOUS | Status: DC | PRN

## 2019-09-09 MED ORDER — LACTATED RINGERS IV SOLN: INTRAVENOUS | Status: DC

## 2019-09-09 MED ORDER — ONDANSETRON HCL 4 MG/2ML IJ SOLN
INTRAMUSCULAR | Status: AC
  Filled 2019-09-09: qty 2

## 2019-09-09 MED ORDER — MORPHINE SULFATE (PF) 0.5 MG/ML IJ SOLN
INTRAMUSCULAR | Status: AC
  Filled 2019-09-09: qty 10

## 2019-09-09 MED ORDER — CLINDAMYCIN PHOSPHATE 900 MG/50ML IV SOLN
900.0000 mg | INTRAVENOUS | Status: AC
  Administered 2019-09-09: 900 mg via INTRAVENOUS

## 2019-09-09 MED ORDER — GENTAMICIN SULFATE 40 MG/ML IJ SOLN
5.0000 mg/kg | INTRAVENOUS | Status: AC
  Administered 2019-09-09: 340 mg via INTRAVENOUS
  Filled 2019-09-09: qty 8.5

## 2019-09-09 MED ORDER — BUPIVACAINE IN DEXTROSE 0.75-8.25 % IT SOLN
INTRATHECAL | Status: DC | PRN
  Administered 2019-09-09: 1.6 mL via INTRATHECAL

## 2019-09-09 SURGICAL SUPPLY — 44 items
BARRIER ADHS 3X4 INTERCEED (GAUZE/BANDAGES/DRESSINGS) ×2 IMPLANT
BENZOIN TINCTURE PRP APPL 2/3 (GAUZE/BANDAGES/DRESSINGS) ×2 IMPLANT
CHLORAPREP W/TINT 26ML (MISCELLANEOUS) ×2 IMPLANT
CLAMP CORD UMBIL (MISCELLANEOUS) IMPLANT
CLOTH BEACON ORANGE TIMEOUT ST (SAFETY) ×2 IMPLANT
DRAPE C SECTION CLR SCREEN (DRAPES) ×2 IMPLANT
DRSG OPSITE POSTOP 4X10 (GAUZE/BANDAGES/DRESSINGS) ×2 IMPLANT
ELECT REM PT RETURN 9FT ADLT (ELECTROSURGICAL) ×2
ELECTRODE REM PT RTRN 9FT ADLT (ELECTROSURGICAL) ×1 IMPLANT
EXTRACTOR VACUUM M CUP 4 TUBE (SUCTIONS) IMPLANT
GLOVE BIOGEL PI IND STRL 7.0 (GLOVE) ×2 IMPLANT
GLOVE BIOGEL PI INDICATOR 7.0 (GLOVE) ×2
GLOVE ECLIPSE 6.5 STRL STRAW (GLOVE) ×2 IMPLANT
GOWN STRL REUS W/TWL LRG LVL3 (GOWN DISPOSABLE) ×4 IMPLANT
KIT ABG SYR 3ML LUER SLIP (SYRINGE) IMPLANT
NEEDLE HYPO 22GX1.5 SAFETY (NEEDLE) ×2 IMPLANT
NEEDLE HYPO 25X5/8 SAFETYGLIDE (NEEDLE) IMPLANT
NS IRRIG 1000ML POUR BTL (IV SOLUTION) ×2 IMPLANT
PACK C SECTION WH (CUSTOM PROCEDURE TRAY) ×2 IMPLANT
PAD OB MATERNITY 4.3X12.25 (PERSONAL CARE ITEMS) ×2 IMPLANT
RTRCTR C-SECT PINK 25CM LRG (MISCELLANEOUS) IMPLANT
STRIP CLOSURE SKIN 1/2X4 (GAUZE/BANDAGES/DRESSINGS) IMPLANT
SUT CHROMIC GUT AB #0 18 (SUTURE) IMPLANT
SUT MNCRL 0 VIOLET CTX 36 (SUTURE) ×3 IMPLANT
SUT MON AB 2-0 SH 27 (SUTURE)
SUT MON AB 2-0 SH27 (SUTURE) IMPLANT
SUT MON AB 3-0 SH 27 (SUTURE)
SUT MON AB 3-0 SH27 (SUTURE) IMPLANT
SUT MON AB 4-0 PS1 27 (SUTURE) IMPLANT
SUT MONOCRYL 0 CTX 36 (SUTURE) ×3
SUT PLAIN 2 0 (SUTURE)
SUT PLAIN 2 0 XLH (SUTURE) IMPLANT
SUT PLAIN ABS 2-0 CT1 27XMFL (SUTURE) IMPLANT
SUT VIC AB 0 CT1 36 (SUTURE) ×4 IMPLANT
SUT VIC AB 2-0 CT1 27 (SUTURE) ×1
SUT VIC AB 2-0 CT1 TAPERPNT 27 (SUTURE) ×1 IMPLANT
SUT VIC AB 3-0 SH 27 (SUTURE) ×1
SUT VIC AB 3-0 SH 27X BRD (SUTURE) ×1 IMPLANT
SUT VIC AB 4-0 PS2 27 (SUTURE) IMPLANT
SYR CONTROL 10ML LL (SYRINGE) ×2 IMPLANT
TAPE STRIPS DRAPE STRL (GAUZE/BANDAGES/DRESSINGS) ×2 IMPLANT
TOWEL OR 17X24 6PK STRL BLUE (TOWEL DISPOSABLE) ×2 IMPLANT
TRAY FOLEY W/BAG SLVR 14FR LF (SET/KITS/TRAYS/PACK) IMPLANT
WATER STERILE IRR 1000ML POUR (IV SOLUTION) ×2 IMPLANT

## 2019-09-09 NOTE — MAU Note (Signed)
Patient presents to MAU c/o ctx q50m.  +FM, denies vaginal bleeding or LOF. Patient reports baby is breech.  Had cervical exam today in office. 3/50%  Last liquid 2100 Last solid 1845

## 2019-09-09 NOTE — Anesthesia Preprocedure Evaluation (Signed)
Anesthesia Evaluation  Patient identified by MRN, date of birth, ID band Patient awake    Reviewed: Allergy & Precautions, NPO status , Patient's Chart, lab work & pertinent test results  Airway Mallampati: II  TM Distance: >3 FB Neck ROM: Full    Dental no notable dental hx.    Pulmonary neg pulmonary ROS,    Pulmonary exam normal breath sounds clear to auscultation       Cardiovascular negative cardio ROS Normal cardiovascular exam Rhythm:Regular Rate:Normal     Neuro/Psych negative neurological ROS  negative psych ROS   GI/Hepatic negative GI ROS, Neg liver ROS,   Endo/Other  negative endocrine ROS  Renal/GU negative Renal ROS  negative genitourinary   Musculoskeletal negative musculoskeletal ROS (+)   Abdominal   Peds negative pediatric ROS (+)  Hematology negative hematology ROS (+) plt 303   Anesthesia Other Findings   Reproductive/Obstetrics (+) Pregnancy Breech presentation, presented in labor and dilating                             Anesthesia Physical Anesthesia Plan  ASA: II and emergent  Anesthesia Plan: Spinal   Post-op Pain Management:    Induction:   PONV Risk Score and Plan: Ondansetron, Dexamethasone and Treatment may vary due to age or medical condition  Airway Management Planned: Natural Airway  Additional Equipment: None  Intra-op Plan:   Post-operative Plan:   Informed Consent: I have reviewed the patients History and Physical, chart, labs and discussed the procedure including the risks, benefits and alternatives for the proposed anesthesia with the patient or authorized representative who has indicated his/her understanding and acceptance.       Plan Discussed with: CRNA  Anesthesia Plan Comments:         Anesthesia Quick Evaluation

## 2019-09-09 NOTE — MAU Note (Signed)
Dr. Garwin Brothers Notified.  OB OR charge nurse notified.  MB Charge nurse notified. OB Anesthesiologist notified.  [facility] notified.

## 2019-09-09 NOTE — H&P (Signed)
Erin [name] is a 34 y.o. female presenting @ 24 1/7 weeks presents in active labor with known breech presentation noted today in office and confirmed by bedside sono. (+) FM. GBS cx (+). OB History    Gravida  3   Para  1   Term  1   Preterm      AB  1   Living  1     SAB  1   TAB      Ectopic      Multiple      Live Births  1          Past Medical History:  Diagnosis Date  . History of cervical LEEP biopsy affecting care of mother, antepartum   . Vaginal Pap smear, abnormal    Past Surgical History:  Procedure Laterality Date  . LEEP  2012   Family History: family history includes Cancer in her maternal grandfather; Diabetes in her mother; Hyperthyroidism in her mother. Social History:  reports that she has never smoked. She has never used smokeless tobacco. She reports previous alcohol use. She reports previous drug use.     Maternal Diabetes: No Genetic Screening: Normal Maternal Ultrasounds/Referrals: Normal Fetal Ultrasounds or other Referrals:  None Maternal Substance Abuse:  No Significant Maternal Medications:  Meds include: Protonix Significant Maternal Lab Results:  Group B Strep positive Other Comments:  None  Review of Systems  All other systems reviewed and are negative.  Maternal Medical History:  Reason for admission: Contractions.   Contractions: Onset was 3-5 hours ago.   Perceived severity is moderate.    Fetal activity: Perceived fetal activity is normal.    Prenatal complications: breech  Prenatal Complications - Diabetes: none.    Dilation: 5 Effacement (%): 80 Station: -2 Exam by:: Dr. Garwin Brothers Blood pressure 111/79, pulse (!) 133, temperature 98.4 F (36.9 C), temperature source Oral, resp. rate 20, height 5\' 5"  (1.651 m), weight 68.6 kg, SpO2 100 %. Exam Physical Exam  Constitutional: She is oriented to person, place, and time. She appears well-developed and well-nourished.  HENT:  Head: Atraumatic.  Eyes:  EOM are normal.  Cardiovascular: Normal rate.  Respiratory: Effort normal.  GI: Soft.  Musculoskeletal:        General: No edema.     Cervical back: Neck supple.  Neurological: She is alert and oriented to person, place, and time.  Skin: Skin is warm and dry.  Psychiatric: She has a normal mood and affect.    Prenatal labs: ABO, Rh: AB/Positive/-- (06/25 0000) Antibody: Negative (06/25 0000) Rubella: Immune (06/25 0000) RPR: Nonreactive (06/25 0000)  HBsAg: Negative (06/25 0000)  HIV: Non-reactive (06/25 0000)  GBS: Positive/-- (12/18 0000)   Assessment/Plan: Breech presentation in labor Term gestation  GBS cx (+) P) Primary C/S. Risk of surgery reviewed including infection, bleeding, injury to bladder, bowel, ureter, poss need for blood transfusion and its risk( HIV, acute rxn, hepatitis), internal scar tissue. All ? Answered. Consent signed. Gent/clindamycin prophylaxis   Tramond Slinker A Selisa Tensley 09/09/2019, 10:53 PM

## 2019-09-09 NOTE — Anesthesia Procedure Notes (Signed)
Spinal  Patient location during procedure: OR Start time: 09/09/2019 11:25 PM End time: 09/09/2019 11:30 PM Staffing Performed: anesthesiologist  Anesthesiologist: Pervis Hocking, DO Preanesthetic Checklist Completed: patient identified, IV checked, risks and benefits discussed, surgical consent, monitors and equipment checked, pre-op evaluation and timeout performed Spinal Block Patient position: sitting Prep: DuraPrep and site prepped and draped Patient monitoring: cardiac monitor, continuous pulse ox and blood pressure Approach: midline Location: L3-4 Injection technique: single-shot Needle Needle type: Pencan  Needle gauge: 24 G Needle length: 9 cm Assessment Sensory level: T6 Additional Notes Functioning IV was confirmed and monitors were applied. Sterile prep and drape, including hand hygiene and sterile gloves were used. The patient was positioned and the spine was prepped. The skin was anesthetized with lidocaine.  Free flow of clear CSF was obtained prior to injecting local anesthetic into the CSF.  The spinal needle aspirated freely following injection.  The needle was carefully withdrawn.  The patient tolerated the procedure well.

## 2019-09-10 ENCOUNTER — Encounter (HOSPITAL_COMMUNITY): Payer: Self-pay | Admitting: Obstetrics and Gynecology

## 2019-09-10 DIAGNOSIS — Z98891 History of uterine scar from previous surgery: Secondary | ICD-10-CM

## 2019-09-10 DIAGNOSIS — Z23 Encounter for immunization: Secondary | ICD-10-CM | POA: Diagnosis not present

## 2019-09-10 DIAGNOSIS — O321XX0 Maternal care for breech presentation, not applicable or unspecified: Secondary | ICD-10-CM

## 2019-09-10 DIAGNOSIS — N8301 Follicular cyst of right ovary: Secondary | ICD-10-CM | POA: Diagnosis not present

## 2019-09-10 DIAGNOSIS — N831 Corpus luteum cyst of ovary, unspecified side: Secondary | ICD-10-CM | POA: Diagnosis not present

## 2019-09-10 LAB — RPR: RPR Ser Ql: NONREACTIVE

## 2019-09-10 MED ORDER — COCONUT OIL OIL: 1.0000 "application " | TOPICAL_OIL | Status: DC | PRN

## 2019-09-10 MED ORDER — LACTATED RINGERS IV SOLN: INTRAVENOUS | Status: DC

## 2019-09-10 MED ORDER — NALBUPHINE HCL 10 MG/ML IJ SOLN: 5.0000 mg | INTRAMUSCULAR | Status: DC | PRN

## 2019-09-10 MED ORDER — MENTHOL 3 MG MT LOZG: 1.0000 | LOZENGE | OROMUCOSAL | Status: DC | PRN

## 2019-09-10 MED ORDER — NALOXONE HCL 4 MG/10ML IJ SOLN
1.0000 ug/kg/h | INTRAVENOUS | Status: DC | PRN
  Filled 2019-09-10: qty 5

## 2019-09-10 MED ORDER — PROMETHAZINE HCL 25 MG/ML IJ SOLN: 6.2500 mg | INTRAMUSCULAR | Status: DC | PRN

## 2019-09-10 MED ORDER — SIMETHICONE 80 MG PO CHEW
80.0000 mg | CHEWABLE_TABLET | Freq: Three times a day (TID) | ORAL | Status: DC
  Administered 2019-09-10 – 2019-09-11 (×5): 80 mg via ORAL
  Filled 2019-09-10 (×5): qty 1

## 2019-09-10 MED ORDER — STERILE WATER FOR IRRIGATION IR SOLN
Status: DC | PRN
  Administered 2019-09-10: 1

## 2019-09-10 MED ORDER — SODIUM CHLORIDE 0.9 % IV SOLN: INTRAVENOUS | Status: DC | PRN

## 2019-09-10 MED ORDER — MEPERIDINE HCL 25 MG/ML IJ SOLN: 6.2500 mg | INTRAMUSCULAR | Status: DC | PRN

## 2019-09-10 MED ORDER — KETOROLAC TROMETHAMINE 30 MG/ML IJ SOLN
30.0000 mg | Freq: Four times a day (QID) | INTRAMUSCULAR | Status: AC
  Administered 2019-09-10 (×2): 30 mg via INTRAVENOUS
  Filled 2019-09-10 (×2): qty 1

## 2019-09-10 MED ORDER — DIPHENHYDRAMINE HCL 25 MG PO CAPS: 25.0000 mg | ORAL_CAPSULE | ORAL | Status: DC | PRN

## 2019-09-10 MED ORDER — OXYTOCIN 40 UNITS IN NORMAL SALINE INFUSION - SIMPLE MED
INTRAVENOUS | Status: AC
  Filled 2019-09-10: qty 1000

## 2019-09-10 MED ORDER — OXYTOCIN 40 UNITS IN NORMAL SALINE INFUSION - SIMPLE MED
INTRAVENOUS | Status: DC | PRN
  Administered 2019-09-10: 40 mL via INTRAVENOUS

## 2019-09-10 MED ORDER — SIMETHICONE 80 MG PO CHEW: 80.0000 mg | CHEWABLE_TABLET | ORAL | Status: DC | PRN

## 2019-09-10 MED ORDER — BUPIVACAINE HCL (PF) 0.25 % IJ SOLN
INTRAMUSCULAR | Status: DC | PRN
  Administered 2019-09-10: 30 mL

## 2019-09-10 MED ORDER — ACETAMINOPHEN 500 MG PO TABS
1000.0000 mg | ORAL_TABLET | Freq: Four times a day (QID) | ORAL | Status: DC
  Administered 2019-09-10 – 2019-09-11 (×7): 1000 mg via ORAL
  Filled 2019-09-10 (×7): qty 2

## 2019-09-10 MED ORDER — DEXAMETHASONE SODIUM PHOSPHATE 10 MG/ML IJ SOLN
INTRAMUSCULAR | Status: DC | PRN
  Administered 2019-09-10: 10 mg via INTRAVENOUS

## 2019-09-10 MED ORDER — TETANUS-DIPHTH-ACELL PERTUSSIS 5-2.5-18.5 LF-MCG/0.5 IM SUSP: 0.5000 mL | Freq: Once | INTRAMUSCULAR | Status: DC

## 2019-09-10 MED ORDER — KETOROLAC TROMETHAMINE 30 MG/ML IJ SOLN
INTRAMUSCULAR | Status: AC
  Filled 2019-09-10: qty 1

## 2019-09-10 MED ORDER — KETOROLAC TROMETHAMINE 30 MG/ML IJ SOLN: 30.0000 mg | Freq: Once | INTRAMUSCULAR | Status: DC | PRN

## 2019-09-10 MED ORDER — NALOXONE HCL 0.4 MG/ML IJ SOLN: 0.4000 mg | INTRAMUSCULAR | Status: DC | PRN

## 2019-09-10 MED ORDER — OXYTOCIN 40 UNITS IN NORMAL SALINE INFUSION - SIMPLE MED: 2.5000 [IU]/h | INTRAVENOUS | Status: AC

## 2019-09-10 MED ORDER — PRENATAL MULTIVITAMIN CH
1.0000 | ORAL_TABLET | Freq: Every day | ORAL | Status: DC
  Administered 2019-09-11: 12:00:00 1 via ORAL
  Filled 2019-09-10 (×2): qty 1

## 2019-09-10 MED ORDER — WITCH HAZEL-GLYCERIN EX PADS: 1.0000 "application " | MEDICATED_PAD | CUTANEOUS | Status: DC | PRN

## 2019-09-10 MED ORDER — SODIUM CHLORIDE 0.9% FLUSH: 3.0000 mL | INTRAVENOUS | Status: DC | PRN

## 2019-09-10 MED ORDER — ONDANSETRON HCL 4 MG/2ML IJ SOLN: 4.0000 mg | Freq: Three times a day (TID) | INTRAMUSCULAR | Status: DC | PRN

## 2019-09-10 MED ORDER — OXYCODONE HCL 5 MG PO TABS: 5.0000 mg | ORAL_TABLET | ORAL | Status: DC | PRN

## 2019-09-10 MED ORDER — ZOLPIDEM TARTRATE 5 MG PO TABS: 5.0000 mg | ORAL_TABLET | Freq: Every evening | ORAL | Status: DC | PRN

## 2019-09-10 MED ORDER — IBUPROFEN 800 MG PO TABS
800.0000 mg | ORAL_TABLET | Freq: Four times a day (QID) | ORAL | Status: DC
  Administered 2019-09-10 – 2019-09-11 (×4): 800 mg via ORAL
  Filled 2019-09-10 (×4): qty 1

## 2019-09-10 MED ORDER — NALBUPHINE HCL 10 MG/ML IJ SOLN: 5.0000 mg | Freq: Once | INTRAMUSCULAR | Status: DC | PRN

## 2019-09-10 MED ORDER — ONDANSETRON HCL 4 MG/2ML IJ SOLN
INTRAMUSCULAR | Status: DC | PRN
  Administered 2019-09-10: 4 mg via INTRAVENOUS

## 2019-09-10 MED ORDER — ACETAMINOPHEN 500 MG PO TABS: 1000.0000 mg | ORAL_TABLET | Freq: Four times a day (QID) | ORAL | Status: DC

## 2019-09-10 MED ORDER — KETOROLAC TROMETHAMINE 30 MG/ML IJ SOLN: 30.0000 mg | Freq: Four times a day (QID) | INTRAMUSCULAR | Status: AC | PRN

## 2019-09-10 MED ORDER — SCOPOLAMINE 1 MG/3DAYS TD PT72
1.0000 | MEDICATED_PATCH | Freq: Once | TRANSDERMAL | Status: DC
  Administered 2019-09-10: 02:00:00 1.5 mg via TRANSDERMAL

## 2019-09-10 MED ORDER — SCOPOLAMINE 1 MG/3DAYS TD PT72
MEDICATED_PATCH | TRANSDERMAL | Status: AC
  Filled 2019-09-10: qty 1

## 2019-09-10 MED ORDER — SODIUM CHLORIDE 0.9 % IR SOLN
Status: DC | PRN
  Administered 2019-09-10: 1

## 2019-09-10 MED ORDER — OXYCODONE HCL 5 MG PO TABS: 5.0000 mg | ORAL_TABLET | Freq: Once | ORAL | Status: DC | PRN

## 2019-09-10 MED ORDER — DIPHENHYDRAMINE HCL 50 MG/ML IJ SOLN: 12.5000 mg | INTRAMUSCULAR | Status: DC | PRN

## 2019-09-10 MED ORDER — DIBUCAINE (PERIANAL) 1 % EX OINT: 1.0000 "application " | TOPICAL_OINTMENT | CUTANEOUS | Status: DC | PRN

## 2019-09-10 MED ORDER — KETOROLAC TROMETHAMINE 30 MG/ML IJ SOLN
30.0000 mg | Freq: Four times a day (QID) | INTRAMUSCULAR | Status: AC | PRN
  Administered 2019-09-10: 02:00:00 30 mg via INTRAVENOUS

## 2019-09-10 MED ORDER — DIPHENHYDRAMINE HCL 25 MG PO CAPS: 25.0000 mg | ORAL_CAPSULE | Freq: Four times a day (QID) | ORAL | Status: DC | PRN

## 2019-09-10 MED ORDER — OXYCODONE HCL 5 MG/5ML PO SOLN: 5.0000 mg | Freq: Once | ORAL | Status: DC | PRN

## 2019-09-10 MED ORDER — HYDROMORPHONE HCL 1 MG/ML IJ SOLN: 0.2500 mg | INTRAMUSCULAR | Status: DC | PRN

## 2019-09-10 MED ORDER — SIMETHICONE 80 MG PO CHEW
80.0000 mg | CHEWABLE_TABLET | ORAL | Status: DC
  Administered 2019-09-10: 80 mg via ORAL
  Filled 2019-09-10: qty 1

## 2019-09-10 MED ORDER — SENNOSIDES-DOCUSATE SODIUM 8.6-50 MG PO TABS
2.0000 | ORAL_TABLET | ORAL | Status: DC
  Administered 2019-09-10: 2 via ORAL
  Filled 2019-09-10: qty 2

## 2019-09-10 NOTE — Progress Notes (Signed)
SVD: primary  S:  Pt reports feeling  well/ Tolerating po/ Voiding without problems/ No n/v/ Bleeding is moderate/ Pain controlled withnarcotic analgesics including oxycodone/acetaminophen (Percocet, Tylox) Tolerate regular diet   O:  A & O x 3 / VS: Blood pressure 106/63, pulse 64, temperature 98.7 F (37.1 C), resp. rate 18, height 5\' 5"  (1.651 m), weight 68.6 kg, SpO2 98 %, unknown if currently breastfeeding.  LABS:  Results for orders placed or performed during the hospital encounter of 09/09/19 (from the past 24 hour(s))  Type and screen [place]     Status: None   Collection Time: 09/09/19 10:15 PM  Result Value Ref Range   ABO/RH(D) AB POS    Antibody Screen NEG    Sample Expiration      09/12/2019,2359 Performed at [facility], [place] [address], [place], [place] [address]   ABO/Rh     Status: None   Collection Time: 09/09/19 10:15 PM  Result Value Ref Range   ABO/RH(D)      AB POS Performed at [facility] [address] [place], [place] [address]   Respiratory Panel by RT PCR (Flu A&B, Covid) - Nasopharyngeal Swab     Status: None   Collection Time: 09/09/19 10:20 PM   Specimen: Nasopharyngeal Swab  Result Value Ref Range   SARS Coronavirus 2 by RT PCR NEGATIVE NEGATIVE   Influenza A by PCR NEGATIVE NEGATIVE   Influenza B by PCR NEGATIVE NEGATIVE  CBC     Status: Abnormal   Collection Time: 09/09/19 10:28 PM  Result Value Ref Range   WBC 15.4 (H) 4.0 - 10.5 K/uL   RBC 4.08 3.87 - 5.11 MIL/uL   Hemoglobin 12.8 12.0 - 15.0 g/dL   HCT 38.1 36.0 - 46.0 %   MCV 93.4 80.0 - 100.0 fL   MCH 31.4 26.0 - 34.0 pg   MCHC 33.6 30.0 - 36.0 g/dL   RDW 12.6 11.5 - 15.5 %   Platelets 303 150 - 400 K/uL   nRBC 0.0 0.0 - 0.2 %    I&O: I/O last 3 completed shifts: In: 3590.5 [P.O.:120; I.V.:3312; IV Piggyback:158.5] Out: 1196 [Urine:650; Blood:546]   Total I/O In: 720 [P.O.:240; I.V.:480] Out: 1000 [Urine:1000]  Lungs: chest clear, no  wheezing, rales, normal symmetric air entry  Heart: regular rate and rhythm, S1, S2 normal, no murmur, click, rub or gallop  Abdomen: soft uterus firm at umb  Perineum: is normal  Lochia: mod  Extremities:edema tr+    A/P: POD # 1/PPD # 1/ EF:[id number]  Doing well  Continue routine post partum orders

## 2019-09-10 NOTE — Transfer of Care (Signed)
Immediate Anesthesia Transfer of Care Note  Patient: Erin [name]  Procedure(s) Performed: CESAREAN SECTION (N/A )  Patient Location: PACU  Anesthesia Type:Spinal  Level of Consciousness: awake, alert  and oriented  Airway & Oxygen Therapy: Patient Spontanous Breathing  Post-op Assessment: Report given to RN and Post -op Vital signs reviewed and stable  Post vital signs: Reviewed and stable  Last Vitals:  Vitals Value Taken Time  BP 110/51 09/10/19 0115  Temp    Pulse 96 09/10/19 0116  Resp 22 09/10/19 0116  SpO2 100 % 09/10/19 0116  Vitals shown include unvalidated device data.  Last Pain:  Vitals:   09/09/19 2157  TempSrc:   PainSc: 6          Complications: No apparent anesthesia complications

## 2019-09-10 NOTE — Brief Op Note (Signed)
09/09/2019 - 09/10/2019  2:23 AM  PATIENT:  Erin [name]  34 y.o. female  PRE-OPERATIVE DIAGNOSIS:  Breech, active labor, term gestation  POST-OPERATIVE DIAGNOSIS:  Right ovarian cyst ? Dermoid, frank breech, term gestation, active labor  PROCEDURE:  Primary cesarean section , kerr hysterotomy, right ovarian cystectomy  SURGEON:  Surgeon(s) and Role:    * Servando Salina, MD - Primary  PHYSICIAN ASSISTANT:   ASSISTANTS: Lars Pinks, CNM   ANESTHESIA:   spinal Findings; live female frank breech, nl tubes , right ov with distal 1 cm mass, left ov nl, post placenta EBL:  506 ml   BLOOD ADMINISTERED:none  DRAINS: none   LOCAL MEDICATIONS USED:  MARCAINE     SPECIMEN:  Source of Specimen:  right ovarian cyst  DISPOSITION OF SPECIMEN:  PATHOLOGY  COUNTS:  YES  TOURNIQUET:  * No tourniquets in log *  DICTATION: .Other Dictation: Dictation Number [id number]  PLAN OF CARE: Admit to inpatient   PATIENT DISPOSITION:  PACU - hemodynamically stable.   Delay start of Pharmacological VTE agent (>24hrs) due to surgical blood loss or risk of bleeding: no

## 2019-09-10 NOTE — Plan of Care (Signed)
  Problem: Education: Goal: Knowledge of General Education information will improve Description: Including pain rating scale, medication(s)/side effects and non-pharmacologic comfort measures Outcome: Completed/Met   Problem: Clinical Measurements: Goal: Ability to maintain clinical measurements within normal limits will improve Outcome: Completed/Met   Problem: Pain Managment: Goal: General experience of comfort will improve Outcome: Completed/Met

## 2019-09-10 NOTE — Plan of Care (Signed)
  Problem: Education: Goal: Knowledge of General Education information will improve Description: Including pain rating scale, medication(s)/side effects and non-pharmacologic comfort measures Outcome: Completed/Met   Problem: Clinical Measurements: Goal: Ability to maintain clinical measurements within normal limits will improve Outcome: Completed/Met Goal: Will remain free from infection Outcome: Completed/Met   Problem: Pain Managment: Goal: General experience of comfort will improve Outcome: Completed/Met   Problem: Safety: Goal: Ability to remain free from injury will improve Outcome: Completed/Met   Problem: Clinical Measurements: Goal: Ability to maintain clinical measurements within normal limits will improve Outcome: Completed/Met   Problem: Pain Managment: Goal: General experience of comfort will improve Outcome: Completed/Met   Problem: Safety: Goal: Ability to remain free from injury will improve Outcome: Completed/Met

## 2019-09-10 NOTE — Op Note (Signed)
NAMECHARDAY, [name] MEDICAL RECORD [id number] ACCOUNT [id number] DATE OF BIRTH:[birth year] FACILITY: [facility] LOCATION: [facility] PHYSICIAN:Adalae Baysinger A. Aryianna Earwood, MD  OPERATIVE REPORT  DATE OF PROCEDURE:  09/09/2019  PREOPERATIVE DIAGNOSES:  Breech presentation in active labor, term gestation, group B strep positive.  PROCEDURE:  Primary cesarean section, Kerr hysterotomy, right ovarian cystectomy.  POSTOPERATIVE DIAGNOSES:  Question right ovarian dermoid, frank breech presentation, term gestation, active labor.  ANESTHESIA:  Spinal.  SURGEON:  Servando Salina, MD  ASSISTANT:  Lars Pinks, CNM  DESCRIPTION OF PROCEDURE:  Under adequate spinal anesthesia, the patient was placed in the supine position with a left lateral tilt.  She was sterilely prepped and draped in the usual fashion.  An indwelling Foley catheter was sterilely placed.  Marcaine  0.25% was injected along the planned Pfannenstiel skin incision site.  Pfannenstiel skin incision was then made, carried down to the rectus fascia.  Rectus fascia was opened transversely.  Rectus fascia was then bluntly and sharply dissected off the  rectus muscle in a superior and inferior fashion.  The rectus muscle was split in the midline.  The parietal peritoneum was entered sharply and extended.  A self-retaining Alexis retractor was then placed.  Large amount of dilated vessels were noted in  the lower uterine segment.  Nonetheless, a curvilinear low transverse incision was made and extended in a cephalic and caudad manner.  Subsequent delivery of a live female from a frank breech presentation using the usual frank breech maneuver was  accomplished.  Baby was delayed cord clamp x1 minute and subsequently transferred to the awaiting pediatricians.  Apgars are 8 and 9 at one and five minutes respectively.  Manual removal of the posterior  placenta was done.  Uterine cavity was cleaned of debris.  Uterine incision was closed in 2 layers,  the first layer with 0 Monocryl running lock stitch, second layer was imbricating using 0 Monocryl suture.  Small bleeding along the peritoneal edges was cauterized.  Found normal tubes  bilaterally, normal left ovary, right ovary with a question of a dermoid cyst that had been previously noted on earlier ultrasound.  Incision was made over the firm 1 cm mass and it was then enucleated from its base, the defect was then closed with 3-0 Vicryl  figure-of-eight sutures and the specimen was sent to pathology.  Abdomen was suctioned of debris after being irrigated.  Interceed in an inverted T fashion was then placed.  Alexis retractor removed.  The parietal peritoneum was closed with 2-0 Vicryl.   The rectus fascia was closed with 0 Vicryl x2.  The subcutaneous area was irrigated, small bleeders cauterized.  Interrupted 2-0 plain sutures placed and the skin approximated using 4-0 Vicryl subcuticular closure.  Steri-Strips and benzoin was placed.  SPECIMEN:  Placenta, not sent to pathology.  Right ovarian cyst sent to pathology.  ESTIMATED BLOOD LOSS:  506 ml  INTRAOPERATIVE FLUIDS:  2500 ml  URINE OUTPUT:  150 of clear yellow urine.  COUNTS:  Sponge and instrument counts x2 correct.  COMPLICATIONS:  None.  DISPOSITION:  The patient tolerated the procedure well and was transferred to recovery room in stable condition. Baby remained in the [place]  JN/NUANCE  D:09/10/2019 T:09/10/2019 JOB:[id number]

## 2019-09-10 NOTE — Lactation Note (Signed)
This note was copied from a baby's chart. Lactation Consultation Note  Patient Name: Erin [name] [id number] Date: 09/10/2019 Reason for consult: Initial assessment   P2, Baby 71 hours old and parents state baby is latching well.  Mother states her milk supply decreased with first baby at 29 weeks. Possible causes mother started birth control, using NS and possible tongue tie. Parents state this baby is latching well.   Offered to view latch but parents state they are waiting on bath. Suggest parents call if they would like assistance. Recommend calling if after discharge mother's supply is of concern. Feed on demand with cues.  Goal 8-12+ times per day after first 24 hrs.  Place baby STS if not cueing.  Mom made aware of O/P services, breastfeeding support groups, community resources, and our phone # for post-discharge questions.     Maternal Data Has patient been taught Hand Expression?: Yes Does the patient have breastfeeding experience prior to this delivery?: Yes  Feeding Feeding Type: Breast Fed  LATCH Score Latch: Grasps breast easily, tongue down, lips flanged, rhythmical sucking.  Audible Swallowing: Spontaneous and intermittent  Type of Nipple: Everted at rest and after stimulation  Comfort (Breast/Nipple): Soft / non-tender  Hold (Positioning): No assistance needed to correctly position infant at breast.  LATCH Score: 10  Interventions Interventions: Breast feeding basics reviewed  Lactation Tools Discussed/Used     Consult Status Consult Status: Follow-up Date: 09/11/19 Follow-up type: In-patient    Vivianne Master [facility] 09/10/2019, 12:30 PM

## 2019-09-10 NOTE — Anesthesia Postprocedure Evaluation (Signed)
Anesthesia Post Note  Patient: Erin [name]  Procedure(s) Performed: CESAREAN SECTION (N/A )     Patient location during evaluation: PACU Anesthesia Type: Spinal Level of consciousness: oriented and awake and alert Pain management: pain level controlled Vital Signs Assessment: post-procedure vital signs reviewed and stable Respiratory status: spontaneous breathing and respiratory function stable Cardiovascular status: blood pressure returned to baseline and stable Postop Assessment: no headache, no backache, no apparent nausea or vomiting and patient able to bend at knees Anesthetic complications: no    Last Vitals:  Vitals:   09/10/19 0130 09/10/19 0145  BP: 111/73 114/74  Pulse: 99 92  Resp: 19 19  Temp:    SpO2: 100% 100%    Last Pain:  Vitals:   09/10/19 0145  TempSrc:   PainSc: 0-No pain   Pain Goal:                   Pervis Hocking

## 2019-09-11 LAB — CBC
HCT: 34.9 % — ABNORMAL LOW (ref 36.0–46.0)
Hemoglobin: 11.1 g/dL — ABNORMAL LOW (ref 12.0–15.0)
MCH: 30.9 pg (ref 26.0–34.0)
MCHC: 31.8 g/dL (ref 30.0–36.0)
MCV: 97.2 fL (ref 80.0–100.0)
Platelets: 272 10*3/uL (ref 150–400)
RBC: 3.59 MIL/uL — ABNORMAL LOW (ref 3.87–5.11)
RDW: 13.2 % (ref 11.5–15.5)
WBC: 14 10*3/uL — ABNORMAL HIGH (ref 4.0–10.5)
nRBC: 0 % (ref 0.0–0.2)

## 2019-09-11 LAB — SURGICAL PATHOLOGY

## 2019-09-11 LAB — ABO/RH: ABO/RH(D): AB POS

## 2019-09-11 MED ORDER — ACETAMINOPHEN 500 MG PO TABS: 1000.0000 mg | ORAL_TABLET | Freq: Four times a day (QID) | ORAL | 0 refills | Status: AC

## 2019-09-11 MED ORDER — OXYCODONE HCL 5 MG PO TABS: 5.0000 mg | ORAL_TABLET | ORAL | 0 refills | Status: AC | PRN

## 2019-09-11 MED ORDER — IBUPROFEN 800 MG PO TABS: 800.0000 mg | ORAL_TABLET | Freq: Four times a day (QID) | ORAL | 0 refills | Status: AC

## 2019-09-11 NOTE — Progress Notes (Signed)
POSTOPERATIVE DAY # 1 S/P Primary LTCS for breech in active labor, baby girl "Vivienne"  S:         Reports feeling good, no pain, requesting early d/c since delivery at midnight.              Tolerating po intake / no nausea / no vomiting / + flatus / + BM x 1   Denies dizziness, SOB, or CP             Bleeding is light             Pain controlled with Motrin and Tylenol Has not required Oxycodone and wants to avoid if possible, but requesting an rx for a few just in case             Up ad lib / ambulatory/ voiding QS  Newborn breast feeding with formula - working on latching; had low supply with first baby and supplemented   O:  VS: BP 103/85 (BP Location: Right Arm)   Pulse 76   Temp 98 F (36.7 C) (Oral)   Resp 14   Ht 5\' 5"  (1.651 m)   Wt 68.6 kg   SpO2 98%   Breastfeeding Unknown   BMI 25.18 kg/m    LABS:               Recent Labs    09/09/19 2228 09/11/19 0643  WBC 15.4* 14.0*  HGB 12.8 11.1*  PLT 303 272               Bloodtype: --/--/AB POS, AB POS Performed at [facility], [address], [place], [place] [address]  [phone])  Rubella: Immune (06/25 0000)                                             I&O: Intake/Output      01/05 0701 - 01/06 0700 01/06 0701 - 01/07 0700   P.O. 1080    I.V. (mL/kg) 480 (7)    IV Piggyback     Total Intake(mL/kg) 1560 (22.7)    Urine (mL/kg/hr) 2950 (1.8)    Blood     Total Output 2950    Net -1390                      Physical Exam:             Alert and Oriented X3  Lungs: Clear and unlabored  Heart: regular rate and rhythm / no murmurs  Abdomen: soft, non-tender, mild gaseous distention              Fundus: firm, non-tender, U-2             Dressing: honeycomb with steri-strips c/d/i              Incision:  approximated with sutures / no erythema / no ecchymosis / no drainage  Perineum: intact  Lochia: scant lochia on pad   Extremities: no edema, no calf pain or tenderness,   A/P:      POD # 1 S/P  Primary LTCS for breech            Mild ABL Anemia    - stable, asymptomatic   Routine postoperative care              Discharge home  today  WOB discharge book given, instructions and warning s/s reviewed    F/u with Dr. Pamala Hurry in 6 weeks   Plan of care for discharge discussed with Dr. Ronita Hipps and agrees as she is an experienced mom, progressing well, and has been 40 hours by time of discharge since surgery   Lars Pinks, MSN, CNM [facility]

## 2019-09-11 NOTE — Discharge Summary (Signed)
Obstetric Discharge Summary   Patient Name: Erin [name] DOB: 1986-06-03 MRN: [id number]  Date of Admission: 09/09/2019 Date of Discharge: 09/11/2019 Date of Delivery: 09/10/2019 Gestational Age at Delivery: [redacted]w[redacted]d  Primary OB: [facility] - Dr. Lenell Antu   Antepartum complications:  Pilar Plate breech presentation - GBS positive  - Hx. Of LEEP  - GERD on protonix  Prenatal Labs:  ABO, Rh: AB/Positive/-- (06/25 0000) Antibody: Negative (06/25 0000) Rubella: Immune (06/25 0000) RPR: Nonreactive (06/25 0000)  HBsAg: Negative (06/25 0000)  HIV: Non-reactive (06/25 0000)  GBS: Positive/-- (12/18 0000)  Admitting Diagnosis: 38 weeks breech presentation in labor   Secondary Diagnoses: Patient Active Problem List   Diagnosis Date Noted  . Breech presentation 09/10/2019  . Normal labor 09/10/2019  . S/P cesarean section: breech presentation in active labor 09/10/2019  . Postpartum care following cesarean delivery (1/5) 09/10/2019    Date of Delivery: 09/10/2019 Delivered By: Dr. Garwin Brothers M. Devarious Pavek CNM assist Delivery Type: primary cesarean section, low transverse incision Anesthesia: spinal   Newborn Data: Live born female  Birth Weight: 7 lb 6 oz (3345 g) APGAR: 58, 9  Newborn Delivery   Birth date/time: 09/10/2019 00:09:00 Delivery type: C-Section, Low Transverse Trial of labor: No C-section categorization: Primary         Hospital/Postpartum Course  (Cesarean Section):  Pt. Admitted at 38 weeks with breech presentation in active labor. Bedside sono confirmed breech and we proceeded with primary LTCS. Patient had an uncomplicated postpartum course. She delivered at midnight and requested early d/c since it was about 40 hours since surgery.  She is progressing well.   By time of discharge on POD#1, her pain was controlled on oral pain medications; she had appropriate lochia and was ambulating, voiding without difficulty, tolerating regular diet and passing flatus.   She  was deemed stable for discharge to home.     Labs: CBC Latest Ref Rng & Units 09/11/2019 09/09/2019 03/04/2016  WBC 4.0 - 10.5 K/uL 14.0(H) 15.4(H) 4.7  Hemoglobin 12.0 - 15.0 g/dL 11.1(L) 12.8 14.0  Hematocrit 36.0 - 46.0 % 34.9(L) 38.1 38.7  Platelets 150 - 400 K/uL [id number] -   Conflict (See Lab Report): AB POS/AB POS Performed at [facility] [address], [place], [place] [address]   Physical exam:  BP 103/85 (BP Location: Right Arm)   Pulse 76   Temp 98 F (36.7 C) (Oral)   Resp 14   Ht 5\' 5"  (1.651 m)   Wt 68.6 kg   SpO2 98%   Breastfeeding Unknown   BMI 25.18 kg/m  Alert and Oriented X3             Lungs: Clear and unlabored             Heart: regular rate and rhythm / no murmurs             Abdomen: soft, non-tender, mild gaseous distention              Fundus: firm, non-tender, U-2             Dressing: honeycomb with steri-strips c/d/i              Incision:  approximated with sutures / no erythema / no ecchymosis / no drainage             Perineum: intact             Lochia: scant lochia on pad  Extremities: no edema, no calf pain or tenderness,    Disposition: stable, discharge to home Baby Feeding: breast milk and formula Baby Disposition: home with mom  Rh Immune globulin given: N/A Rubella vaccine given: N/A Tdap vaccine given in AP or PP setting: UTD 2020 Flu vaccine given in AP or PP setting: UTD 2020   Plan:  Renitta Stockman was discharged to home in good condition. Follow-up appointment at [facility] in 6 weeks.  Discharge Instructions: Per After Visit Summary. Refer to After Visit Summary and [facility] discharge booklet  Activity: Advance as tolerated. Pelvic rest for 6 weeks.   Diet: Regular, Heart Healthy Discharge Medications: Allergies as of 09/11/2019      Reactions   Amoxicillin    Shrimp [shellfish Allergy] Swelling   Nickel Rash      Medication List    TAKE these medications   acetaminophen 500 MG  tablet Commonly known as: TYLENOL Take 2 tablets (1,000 mg total) by mouth every 6 (six) hours.   ibuprofen 800 MG tablet Commonly known as: ADVIL Take 1 tablet (800 mg total) by mouth every 6 (six) hours.   oxyCODONE 5 MG immediate release tablet Commonly known as: Oxy IR/ROXICODONE Take 1-2 tablets (5-10 mg total) by mouth every 4 (four) hours as needed for moderate pain.   prenatal multivitamin Tabs tablet Take 1 tablet by mouth daily at 12 noon.            Discharge Care Instructions  (From admission, onward)         Start     Ordered   09/11/19 0000  Discharge wound care:    Comments: Remove honeycomb dressing on Sunday.  Leave steri-strips in place for 1-2 weeks then remove.  Keep incision clean and dry.   09/11/19 1644         Outpatient follow up:  Follow-up Information    Aloha Gell, MD. Schedule an appointment as soon as possible for a visit in 6 week(s).   Specialty: Obstetrics and Gynecology Why: Postpartum visit  Contact information: [place] [address] [phone]           Signed:  Lars Pinks, MSN, CNM [facility]

## 2019-09-13 DIAGNOSIS — R634 Abnormal weight loss: Secondary | ICD-10-CM | POA: Diagnosis not present

## 2019-09-13 DIAGNOSIS — O321XX0 Maternal care for breech presentation, not applicable or unspecified: Secondary | ICD-10-CM | POA: Diagnosis not present

## 2019-09-13 DIAGNOSIS — Z00110 Health examination for newborn under 8 days old: Secondary | ICD-10-CM | POA: Diagnosis not present

## 2019-09-14 ENCOUNTER — Other Ambulatory Visit (HOSPITAL_COMMUNITY): Payer: BC Managed Care – PPO

## 2019-09-16 ENCOUNTER — Inpatient Hospital Stay (HOSPITAL_COMMUNITY): Payer: BC Managed Care – PPO

## 2019-09-16 ENCOUNTER — Inpatient Hospital Stay (HOSPITAL_COMMUNITY): Admission: AD | Admit: 2019-09-16 | Payer: BC Managed Care – PPO | Source: Home / Self Care | Admitting: Obstetrics

## 2019-10-19 ENCOUNTER — Ambulatory Visit: Payer: BC Managed Care – PPO

## 2019-10-31 ENCOUNTER — Ambulatory Visit: Payer: BC Managed Care – PPO

## 2019-11-02 ENCOUNTER — Ambulatory Visit: Payer: BC Managed Care – PPO | Attending: Internal Medicine

## 2019-11-02 DIAGNOSIS — Z23 Encounter for immunization: Secondary | ICD-10-CM | POA: Insufficient documentation

## 2019-11-02 NOTE — Progress Notes (Signed)
   Covid-19 Vaccination Clinic  Name:  Azka Wickenhauser    MRN: ID:[id number] DOB: Aug 09, 1986  11/02/2019  Ms. Pickup was observed post Covid-19 immunization for 15 minutes without incidence. She was provided with Vaccine Information Sheet and instruction to access the V-Safe system.   Ms. Mcclenney was instructed to call 911 with any severe reactions post vaccine: Marland Kitchen Difficulty breathing  . Swelling of your face and throat  . A fast heartbeat  . A bad rash all over your body  . Dizziness and weakness    Immunizations Administered    Name Date Dose VIS Date Route   Pfizer COVID-19 Vaccine 11/02/2019 10:43 AM 0.3 mL 08/16/2019 Intramuscular   Manufacturer: [employer]   Lot: [id number]   [place]: [id number]

## 2019-11-04 DIAGNOSIS — Z3202 Encounter for pregnancy test, result negative: Secondary | ICD-10-CM | POA: Diagnosis not present

## 2019-11-04 DIAGNOSIS — Z30430 Encounter for insertion of intrauterine contraceptive device: Secondary | ICD-10-CM | POA: Diagnosis not present

## 2019-11-21 DIAGNOSIS — Z3482 Encounter for supervision of other normal pregnancy, second trimester: Secondary | ICD-10-CM | POA: Diagnosis not present

## 2019-11-21 DIAGNOSIS — Z3483 Encounter for supervision of other normal pregnancy, third trimester: Secondary | ICD-10-CM | POA: Diagnosis not present

## 2019-11-23 ENCOUNTER — Ambulatory Visit: Payer: BC Managed Care – PPO | Attending: Internal Medicine

## 2019-11-23 DIAGNOSIS — Z23 Encounter for immunization: Secondary | ICD-10-CM

## 2019-11-23 NOTE — Progress Notes (Signed)
   Covid-19 Vaccination Clinic  Name:  Erin [name]    MRN: ID:[id number] DOB: 02-08-1986  11/23/2019  Erin [name] was observed post Covid-19 immunization for 15 minutes without incident. She was provided with Vaccine Information Sheet and instruction to access the V-Safe system.   Erin [name] was instructed to call 911 with any severe reactions post vaccine: Marland Kitchen Difficulty breathing  . Swelling of face and throat  . A fast heartbeat  . A bad rash all over body  . Dizziness and weakness   Immunizations Administered    Name Date Dose VIS Date Route   Pfizer COVID-19 Vaccine 11/23/2019 10:55 AM 0.3 mL 08/16/2019 Intramuscular   Manufacturer: [place]   Lot: [id number]   [place]: [id number]

## 2019-11-27 ENCOUNTER — Ambulatory Visit: Payer: BC Managed Care – PPO

## 2019-12-09 DIAGNOSIS — Z30431 Encounter for routine checking of intrauterine contraceptive device: Secondary | ICD-10-CM | POA: Diagnosis not present

## 2019-12-12 DIAGNOSIS — M205X2 Other deformities of toe(s) (acquired), left foot: Secondary | ICD-10-CM | POA: Diagnosis not present

## 2019-12-12 DIAGNOSIS — L602 Onychogryphosis: Secondary | ICD-10-CM | POA: Diagnosis not present

## 2019-12-18 DIAGNOSIS — B351 Tinea unguium: Secondary | ICD-10-CM | POA: Diagnosis not present

## 2020-01-02 DIAGNOSIS — M205X2 Other deformities of toe(s) (acquired), left foot: Secondary | ICD-10-CM | POA: Diagnosis not present

## 2020-01-02 DIAGNOSIS — L602 Onychogryphosis: Secondary | ICD-10-CM | POA: Diagnosis not present

## 2020-01-02 DIAGNOSIS — B351 Tinea unguium: Secondary | ICD-10-CM | POA: Diagnosis not present

## 2020-03-30 DIAGNOSIS — Z13220 Encounter for screening for lipoid disorders: Secondary | ICD-10-CM | POA: Diagnosis not present

## 2020-03-30 DIAGNOSIS — Z0000 Encounter for general adult medical examination without abnormal findings: Secondary | ICD-10-CM | POA: Diagnosis not present

## 2020-03-30 DIAGNOSIS — Z1159 Encounter for screening for other viral diseases: Secondary | ICD-10-CM | POA: Diagnosis not present

## 2020-04-01 DIAGNOSIS — S0502XA Injury of conjunctiva and corneal abrasion without foreign body, left eye, initial encounter: Secondary | ICD-10-CM | POA: Diagnosis not present

## 2020-04-01 DIAGNOSIS — Z0000 Encounter for general adult medical examination without abnormal findings: Secondary | ICD-10-CM | POA: Diagnosis not present

## 2020-04-06 DIAGNOSIS — M205X2 Other deformities of toe(s) (acquired), left foot: Secondary | ICD-10-CM | POA: Diagnosis not present

## 2020-04-06 DIAGNOSIS — L602 Onychogryphosis: Secondary | ICD-10-CM | POA: Diagnosis not present

## 2020-04-06 DIAGNOSIS — B351 Tinea unguium: Secondary | ICD-10-CM | POA: Diagnosis not present

## 2020-06-16 ENCOUNTER — Other Ambulatory Visit: Payer: Self-pay

## 2020-06-16 DIAGNOSIS — Z20822 Contact with and (suspected) exposure to covid-19: Secondary | ICD-10-CM

## 2020-06-18 LAB — NOVEL CORONAVIRUS, NAA: SARS-CoV-2, NAA: NOT DETECTED

## 2020-06-18 LAB — SARS-COV-2, NAA 2 DAY TAT

## 2021-03-28 LAB — PAP IG, APTIMA HPV AND RFX 16/18,45 (199305)
.: 0
HPV Aptima: POSITIVE — AB
HPV Genotype 18 and 45: NEGATIVE
HPV, Genotype 16: NEGATIVE

## 2021-05-20 ENCOUNTER — Ambulatory Visit
Admit: 2021-05-20 | Discharge: 2021-05-20 | Payer: PRIVATE HEALTH INSURANCE | Attending: Surgical | Primary: Family Medicine

## 2021-05-20 DIAGNOSIS — B977 Papillomavirus as the cause of diseases classified elsewhere: Secondary | ICD-10-CM

## 2021-05-20 NOTE — Progress Notes (Signed)
Colposcopy Procedure Note    Indications: Pap smear 1 months ago showed:  hpv . The prior pap showed benign cellular changes.  Prior cervical/vaginal disease: CIN 2. Prior cervical treatment:  LEEP .    Procedure Details   The risks and benefits of the procedure and Verbal informed consent obtained.    Speculum placed in vagina and excellent visualization of cervix achieved, cervix swabbed x 3 with acetic acid solution.    Findings:  Cervix: no visible lesions; endocervical curettage performed.  Vaginal inspection: vaginal colposcopy not performed.  Vulvar colposcopy: vulvar colposcopy not performed.    Specimens: ECC     Complications: none.    Plan:  Specimens labelled and sent to Pathology.  Will base further treatment on Pathology findings.  Post biopsy instructions given to patient.

## 2021-05-26 NOTE — Addendum Note (Signed)
Addended by: Aram Candela D on: 05/26/2021 09:57 AM     Modules accepted: Orders

## 2021-07-21 ENCOUNTER — Encounter: Payer: PRIVATE HEALTH INSURANCE | Attending: Family Medicine | Primary: Family Medicine

## 2021-07-26 ENCOUNTER — Ambulatory Visit
Admit: 2021-07-26 | Discharge: 2021-07-26 | Payer: PRIVATE HEALTH INSURANCE | Attending: Medical | Primary: Family Medicine

## 2021-07-26 DIAGNOSIS — R109 Unspecified abdominal pain: Secondary | ICD-10-CM

## 2021-07-30 NOTE — Progress Notes (Signed)
CHIEF COMPLAINT:  Chief Complaint   Patient presents with    Abdominal Pain     Right side discomfort (has stopped), some mucous in stool, air bubbles she can feel        HISTORY OF PRESENT ILLNESS:  Ms. Rasnick is a 35 y.o. female  who presents with a complaint of some intermittant but currently resolved right lower quadrant heaviness.  She notes that this has been occurring since her last pregnancy which was delivered through C-Section.  She notes that she has had a history of ovarian cyst, but otherwise no other intraabdominal / pelvic history.    CURRENT MEDICATION LIST:    Current Outpatient Medications   Medication Sig Dispense Refill    acetaminophen (TYLENOL) 500 MG tablet Take 1,000 mg by mouth in the morning and 1,000 mg at noon and 1,000 mg in the evening and 1,000 mg before bedtime.      ibuprofen (ADVIL;MOTRIN) 800 MG tablet Take 800 mg by mouth in the morning and 800 mg at noon and 800 mg in the evening and 800 mg before bedtime.      levonorgestrel (MIRENA) IUD 52 mg Sig: as directed Intrauterine , Notes: 2021 june       No current facility-administered medications for this visit.        ALLERGIES:    Allergies   Allergen Reactions    Shellfish Allergy Swelling    Shrimp (Diagnostic)      Other reaction(s): lip swelling    Amoxicillin Rash    Nickel Rash        REVIEW OF SYSTEMS:  Review of Systems   Constitutional:  Negative for chills and fever.   HENT:  Negative for congestion, ear pain, hearing loss and sore throat.    Eyes: Negative.  Negative for visual disturbance.   Respiratory:  Negative for cough and shortness of breath.    Cardiovascular:  Negative for chest pain and palpitations.   Gastrointestinal:  Positive for abdominal pain (heaviness). Negative for constipation, diarrhea and nausea.   Endocrine: Negative for cold intolerance and heat intolerance.   Genitourinary:  Negative for dysuria and frequency.   Musculoskeletal:  Negative for arthralgias.      PHYSICAL EXAM:  Vital Signs -    Visit Vitals  BP (!) 100/56   Pulse 92   Wt 127 lb 6.4 oz (57.8 kg)   SpO2 98%   BMI 20.56 kg/m??        Physical Exam  Constitutional:       General: She is not in acute distress.  HENT:      Head: Normocephalic and atraumatic.   Eyes:      Extraocular Movements: Extraocular movements intact.      Pupils: Pupils are equal, round, and reactive to light.   Cardiovascular:      Rate and Rhythm: Normal rate and regular rhythm.   Pulmonary:      Effort: No respiratory distress.      Breath sounds: No wheezing or rhonchi.   Skin:     General: Skin is warm and dry.   Neurological:      General: No focal deficit present.      Mental Status: She is oriented to person, place, and time.   Psychiatric:         Mood and Affect: Mood normal.         Behavior: Behavior normal.        IMPRESSION/PLAN    1. Nonspecific  abdominal pain  Largely resolved.  Sounds most consistent with ovarian cyst with subsequent rupture.  I would be hesitant to image unless her symptoms return or worsen. At this time she has a normal exam, without associated symptoms.  Also in the differential would be post-operative stricture without obstruction but I believe this is comparatively less likely.  Reviewed red flag signs including signs of bowel obstruction though again seems unlikely.  Notify us with any changes or if she desires additional workup I would consider CT abdomen/pelvis.    Requested Prescriptions      No prescriptions requested or ordered in this encounter       Follow up and Dispositions:  Return if symptoms worsen or fail to improve.     Grace Isaac III, PA-C

## 2021-11-18 ENCOUNTER — Ambulatory Visit
Admit: 2021-11-18 | Discharge: 2021-11-18 | Payer: PRIVATE HEALTH INSURANCE | Attending: Surgical | Primary: Family Medicine

## 2021-11-18 DIAGNOSIS — B977 Papillomavirus as the cause of diseases classified elsewhere: Secondary | ICD-10-CM

## 2021-11-23 NOTE — Progress Notes (Signed)
Chief Complaint:  Chief Complaint   Patient presents with    Other     6 month repeat pap        HPI:  37 y.o. [id number] presents for repeat pap. 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      PE:  BP 118/70 (Site: Left Upper Arm, Position: Sitting, Cuff Size: Small Adult)    Ht 5\' 6"  (1.676 m)    Wt 128 lb 8 oz (58.3 kg)    BMI 20.74 kg/m??     Physical Exam  Constitutional:       General: She is not in acute distress.     Appearance: Normal appearance.   Genitourinary:      No lesions in the vagina.      Right Labia: No rash or lesions.     Left Labia: No lesions or rash.     No vaginal discharge or erythema.        Right Adnexa: not tender and no mass present.     Left Adnexa: not tender and no mass present.     No cervical discharge, friability or lesion.      Uterus is not enlarged or tender.      Uterus is anteverted.      Bladder is not tender.    Abdominal:      General: There is no distension.      Tenderness: There is no abdominal tenderness.   Neurological:      Mental Status: She is alert.   Skin:     General: Skin is warm and dry.   Psychiatric:         Judgment: Judgment normal.        A/P:  Tranesha was seen today for other.    Diagnoses and all orders for this visit:    High risk human papilloma virus (HPV) infection of cervix  -     PAP IG, Aptima HPV and rfx 16/18,45 Morrie Sheldon)  ([facility] DEFAULT)       Return if symptoms worsen or fail to improve, for Moving.     ([id number], Dannielle Karvonen

## 2021-11-26 LAB — PAP IG, APTIMA HPV AND RFX 16/18,45 (199305)
.: 0
HPV Aptima: POSITIVE — AB

## 2021-11-26 LAB — HPV GENOTYPES 16/18,45
HPV Genotype 18 and 45: NEGATIVE
HPV, Genotype 16: NEGATIVE

## 2022-03-23 ENCOUNTER — Encounter: Payer: PRIVATE HEALTH INSURANCE | Attending: Family Medicine | Primary: Family Medicine

## 2022-09-07 NOTE — Telephone Encounter (Signed)
Patient moved and is requesting her records me transferred to:    [facility] in [place], [place]    Fax:  [phone]    Patient was transferred to Medical Records Department and will be looking into uploading authorization of release of info form.
